# Patient Record
Sex: Male | Born: 2000 | Race: White | Hispanic: No | Marital: Single | Smoking: Never smoker
Health system: Southern US, Community
[De-identification: ages and names within clinical notes are randomized; demographics above are authoritative.]

## PROBLEM LIST (undated history)

## (undated) DIAGNOSIS — F909 Attention-deficit hyperactivity disorder, unspecified type: Secondary | ICD-10-CM

## (undated) DIAGNOSIS — R112 Nausea with vomiting, unspecified: Secondary | ICD-10-CM

## (undated) DIAGNOSIS — R040 Epistaxis: Secondary | ICD-10-CM

## (undated) DIAGNOSIS — Z9889 Other specified postprocedural states: Secondary | ICD-10-CM

## (undated) HISTORY — PX: NOSE SURGERY: SHX723

---
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
## 2022-02-04 ENCOUNTER — Encounter (HOSPITAL_BASED_OUTPATIENT_CLINIC_OR_DEPARTMENT_OTHER): Payer: Self-pay | Admitting: Emergency Medicine

## 2022-02-04 ENCOUNTER — Emergency Department (HOSPITAL_BASED_OUTPATIENT_CLINIC_OR_DEPARTMENT_OTHER): Payer: Medicaid Other | Admitting: Radiology

## 2022-02-04 ENCOUNTER — Other Ambulatory Visit (HOSPITAL_BASED_OUTPATIENT_CLINIC_OR_DEPARTMENT_OTHER): Payer: Self-pay

## 2022-02-04 ENCOUNTER — Other Ambulatory Visit: Payer: Self-pay

## 2022-02-04 ENCOUNTER — Emergency Department (HOSPITAL_BASED_OUTPATIENT_CLINIC_OR_DEPARTMENT_OTHER)
Admission: EM | Admit: 2022-02-04 | Discharge: 2022-02-04 | Disposition: A | Discharge status: Home / Self Care | Payer: Medicaid Other | Attending: Emergency Medicine | Admitting: Emergency Medicine

## 2022-02-04 DIAGNOSIS — Y9364 Activity, baseball: Secondary | ICD-10-CM | POA: Insufficient documentation

## 2022-02-04 DIAGNOSIS — X501XXA Overexertion from prolonged static or awkward postures, initial encounter: Secondary | ICD-10-CM | POA: Diagnosis not present

## 2022-02-04 DIAGNOSIS — S43005A Unspecified dislocation of left shoulder joint, initial encounter: Secondary | ICD-10-CM | POA: Diagnosis not present

## 2022-02-04 DIAGNOSIS — S4992XA Unspecified injury of left shoulder and upper arm, initial encounter: Secondary | ICD-10-CM | POA: Diagnosis present

## 2022-02-04 DIAGNOSIS — M25512 Pain in left shoulder: Secondary | ICD-10-CM | POA: Insufficient documentation

## 2022-02-04 MED ORDER — ACETAMINOPHEN 500 MG PO TABS
500.0000 mg | ORAL_TABLET | Freq: Four times a day (QID) | ORAL | 0 refills | Status: DC | PRN
  Filled 2022-02-04: qty 30, 8d supply, fill #0

## 2022-02-04 MED ORDER — NAPROXEN 250 MG PO TABS
500.0000 mg | ORAL_TABLET | Freq: Once | ORAL | Status: AC
  Administered 2022-02-04: 500 mg via ORAL
  Filled 2022-02-04: qty 2

## 2022-02-04 MED ORDER — ACETAMINOPHEN 325 MG PO TABS
650.0000 mg | ORAL_TABLET | Freq: Once | ORAL | Status: AC
  Administered 2022-02-04: 650 mg via ORAL
  Filled 2022-02-04: qty 2

## 2022-02-04 MED ORDER — NAPROXEN 500 MG PO TABS
500.0000 mg | ORAL_TABLET | Freq: Two times a day (BID) | ORAL | 0 refills | Status: DC
  Filled 2022-02-04: qty 10, 5d supply, fill #0

## 2022-02-04 NOTE — ED Triage Notes (Signed)
Pt left shoulder popped out of joint yesterday during baseball game. Coach popped shoulder back into joint. Pt thinks shoulder popped back out during sleep last night. Limited range of motion.  Fingers "slightly" numb. Color good, cap refill WDL and pulse good.

## 2022-02-04 NOTE — ED Provider Notes (Signed)
[facility] EMERGENCY DEPT Provider Note   CSN: [id number] Arrival date & time: 02/04/22  0930     History  Chief Complaint  Patient presents with   Shoulder Pain    Eric Tapia is a 21 y.o. male.  HPI     21 year old male comes in with chief complaint of shoulder pain.  Patient indicates that he was playing baseball yesterday, when he dove to catch a ball.  In the process he landed on his left shoulder.  He heard a pop.  His coach " popped" his shoulder back.  He thinks that the shoulder might of popped out again during sleep.  He is having pain over the left shoulder.  He has mild numbness to his digits.   Home Medications Prior to Admission medications   Medication Sig Start Date End Date Taking? Authorizing Provider  acetaminophen (TYLENOL) 500 MG tablet Take 1 tablet (500 mg total) by mouth every 6 (six) hours as needed. 02/04/22  Yes Mycheal Veldhuizen, MD  naproxen (NAPROSYN) 500 MG tablet Take 1 tablet (500 mg total) by mouth 2 (two) times daily. 02/04/22  Yes Derwood Kaplan, MD  ibuprofen (ADVIL) 200 MG tablet Take 400 mg by mouth every 8 (eight) hours as needed.    [provider]      Allergies    Patient has no known allergies.    Review of Systems   Review of Systems  Physical Exam Updated Vital Signs BP 115/69 (BP Location: Right Arm)   Pulse (!) 57   Temp 97.8 F (36.6 C) (Oral)   Resp 17   Ht 6\' 5"  (1.956 m)   Wt 68 kg   SpO2 100%   BMI 17.79 kg/m  Physical Exam Vitals and nursing note reviewed.  Constitutional:      Appearance: He is well-developed.  HENT:     Head: Atraumatic.  Cardiovascular:     Rate and Rhythm: Normal rate.  Pulmonary:     Effort: Pulmonary effort is normal.  Musculoskeletal:        General: Tenderness present.     Cervical back: Neck supple.     Comments: No gross deformity or swelling.  Patient has tenderness over the distal clavicle, AC joint and GH joint area.  Patient has tenderness with minimal  range of motion at this time.  Skin:    General: Skin is warm.  Neurological:     Mental Status: He is alert and oriented to person, place, and time.     ED Results / Procedures / Treatments   Labs (all labs ordered are listed, but only abnormal results are displayed) Labs Reviewed - No data to display  EKG None  Radiology DG Shoulder Left  Result Date: 02/04/2022 CLINICAL DATA:  Left shoulder dislocation during a baseball game yesterday. EXAM: LEFT SHOULDER - 2+ VIEW COMPARISON:  Left humerus x-rays dated Dec 13, 2018. FINDINGS: There is no evidence of fracture or dislocation. There is no evidence of arthropathy or other focal bone abnormality. Soft tissues are unremarkable. IMPRESSION: Negative. Electronically Signed   By: Dec 15, 2018 M.D.   On: 02/04/2022 10:16    Procedures Procedures    Medications Ordered in ED Medications  naproxen (NAPROSYN) tablet 500 mg (500 mg Oral Given 02/04/22 1045)  acetaminophen (TYLENOL) tablet 650 mg (650 mg Oral Given 02/04/22 1045)    ED Course/ Medical Decision Making/ A&P  Medical Decision Making Amount and/or Complexity of Data Reviewed Radiology: ordered.  Risk OTC drugs. Prescription drug management.  This patient presents to the ED with chief complaint(s) of shoulder pain with pertinent past medical history of shoulder injury yesterday, where his shoulder popped out and was " popped back in" by the code which further complicates the presenting complaint.   The differential diagnosis includes : Shoulder dislocation, shoulder avulsion, AC separation, occult humeral fracture, soft tissue sprain, strain or tear.  The initial plan is to put patient in a sling.  Advised RICE treatment with anti-inflammatory medication.  Patient will need to see an orthopedist in 1 week.   Additional history obtained: Additional history obtained from  family member at the bedside   Independent visualization of  imaging: - I independently visualized the following imaging with scope of interpretation limited to determining acute life threatening conditions related to emergency care: X-ray of the left shoulder, which revealed questionable occult fracture in the proximal humeral head.  No dislocation.  Treatment and Reassessment: Results discussed.  Will discharge with sling.   Final Clinical Impression(s) / ED Diagnoses Final diagnoses:  Shoulder dislocation, left, initial encounter    Rx / DC Orders ED Discharge Orders          Ordered    naproxen (NAPROSYN) 500 MG tablet  2 times daily        02/04/22 1054    acetaminophen (TYLENOL) 500 MG tablet  Every 6 hours PRN        02/04/22 1054              Derwood Kaplan, MD 02/04/22 1110

## 2022-02-04 NOTE — Discharge Instructions (Addendum)
X-rays reassuring.  There could be a small fracture that is being missed on the x-ray. Given that you had shoulder dislocation or avulsion, it is still prudent that you follow-up with the orthopedic doctor and keep the arm in the sling until then.  Basic range of motion exercises can still be done while the arm is in the sling.  Ice the area of pain aggressively for the next 2 days.  Take Naprosyn for anti-inflammatory effects and Tylenol in between as needed for pain control.  Call the orthopedist at the number provided to set up an appointment in 7 to 10 days.

## 2022-02-13 ENCOUNTER — Ambulatory Visit (INDEPENDENT_AMBULATORY_CARE_PROVIDER_SITE_OTHER): Payer: Medicaid Other | Admitting: Orthopaedic Surgery

## 2022-02-13 DIAGNOSIS — S43015A Anterior dislocation of left humerus, initial encounter: Secondary | ICD-10-CM | POA: Diagnosis not present

## 2022-02-13 NOTE — Progress Notes (Signed)
Chief Complaint: Left shoulder dislocation     History of Present Illness:    Eric Tapia is a 21 y.o. male left hand dominant male presents with a left anterior shoulder dislocation while he was playing baseball on July 30 and going away out for catching outfield.  He states that several days later he was playing in bed and felt the shoulder again pop out and relocate when he rolled onto it.  Denies any history of shoulder instability.  Denies any familial history of ligamentous laxity.  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  He does not have any other significant medical history.  He has been working on a home exercise program with regard to the left shoulder strengthening since this occurred.  Here today for further assessment.    Surgical History:   None  PMH/PSH/Family History/Social History/Meds/Allergies:    Past Medical History:  Diagnosis Date   Attention deficit hyperactivity disorder (ADHD)    Epistaxis    Past Surgical History:  Procedure Laterality Date   NOSE SURGERY     Social History   Socioeconomic History   Marital status: Single    Spouse name: Not on file   Number of children: Not on file   Years of education: Not on file   Highest education level: Not on file  Occupational History   Not on file  Tobacco Use   Smoking status: Passive Smoke Exposure - Never Smoker   Smokeless tobacco: Never  Vaping Use   Vaping Use: Not on file  Substance and Sexual Activity   Alcohol use: No   Drug use: No   Sexual activity: Not Currently  Other Topics Concern   Not on file  Social History Narrative   Not on file   Social Determinants of Health   Financial Resource Strain: Not on file  Food Insecurity: Not on file  Transportation Needs: Not on file  Physical Activity: Not on file  Stress: Not on file  Social Connections: Not on file   No family history on file. No Known  Allergies Current Outpatient Medications  Medication Sig Dispense Refill   acetaminophen (TYLENOL) 500 MG tablet Take 1 tablet (500 mg total) by mouth every 6 (six) hours as needed. 30 tablet 0   ibuprofen (ADVIL) 200 MG tablet Take 400 mg by mouth every 8 (eight) hours as needed.     naproxen (NAPROSYN) 500 MG tablet Take 1 tablet (500 mg total) by mouth 2 (two) times daily. 10 tablet 0   No current facility-administered medications for this visit.   No results found.  Review of Systems:   A ROS was performed including pertinent positives and negatives as documented in the HPI.  Physical Exam :   Constitutional: NAD and appears stated age Neurological: Alert and oriented Psych: Appropriate affect and cooperative There were no vitals taken for this visit.   Comprehensive Musculoskeletal Exam:    Musculoskeletal Exam    Inspection Right Left  Skin No atrophy or winging No atrophy or winging  Palpation    Tenderness None Glenohumeral  Range oGlenohumeralotion    Flexion (passive) 170 170  Flexion (active) 170 170  Abduction 170 170  ER at the side 70 70  Can reach behind back to T12 T12  Strength  Full Full  Special Tests    Pseudoparalytic No No  Neurologic    Fires PIN, radial, median, ulnar, musculocutaneous, axillary, suprascapular, long thoracic, and spinal accessory innervated muscles. No abnormal sensibility  Vascular/Lymphatic    Radial Pulse 2+ 2+  Cervical Exam    Patient has symmetric cervical range of motion with negative Spurling's test.  Special Test: Positive apprehension with improved apprehension with an anterior relocation force     Imaging:   Xray (3 views left shoulder): Normal   I personally reviewed and interpreted the radiographs.   Assessment:   21 y.o. male left-hand-dominant male presents with a left anterior shoulder dislocation that has now occurred twice following an injury where he landed on the arm playing baseball 2 weeks prior.   I did discuss that given the fact that he has not had to instability episodes 1 of which was just laying on the shoulder I do believe an MRI is indicated in order to rule out a labral injury and for further discussion of treating his instability.  We will plan to proceed with an MRI with intra-articular contrast and return to clinic following this.  Plan :    -Plan for left shoulder MRI  I believe that advance imaging in the form of an MRI is indicated for the following reasons: -Xrays images were obtained and not diagnostic -The patient has failed treatment modalities including rest, home program -The following worrisome symptoms are present on history and exam: Acute injury specific to an anterior shoulder dislocation with concern for labral injury - MRI is required to assist in specific surgical planning       I personally saw and evaluated the patient, and participated in the management and treatment plan.  Huel Cote, MD Attending Physician, Orthopedic Surgery  This document was dictated using Dragon voice recognition software. A reasonable attempt at proof reading has been made to minimize errors.

## 2022-02-14 ENCOUNTER — Telehealth: Payer: Self-pay | Admitting: Orthopaedic Surgery

## 2022-02-14 NOTE — Telephone Encounter (Signed)
Pt's grandmother called requesting a call back from [place] . She states she has questions about MRI/Surgery. Phone number is [phone].

## 2022-02-14 NOTE — Telephone Encounter (Signed)
LMOM requesting CB

## 2022-02-14 NOTE — Telephone Encounter (Signed)
Patient's grandmother needed CPT codes for potential surgery for patient to provide to [facility], I provided code [id number] but informed the patient's grandmother, that we can not guarantee anything until the MRI is reviewed

## 2022-02-18 ENCOUNTER — Other Ambulatory Visit (HOSPITAL_BASED_OUTPATIENT_CLINIC_OR_DEPARTMENT_OTHER): Payer: Self-pay

## 2022-02-25 ENCOUNTER — Other Ambulatory Visit: Payer: Self-pay

## 2022-02-25 ENCOUNTER — Inpatient Hospital Stay: Admission: RE | Admit: 2022-02-25 | Payer: Self-pay | Source: Ambulatory Visit

## 2022-02-27 ENCOUNTER — Ambulatory Visit (HOSPITAL_BASED_OUTPATIENT_CLINIC_OR_DEPARTMENT_OTHER): Payer: Self-pay | Admitting: Orthopaedic Surgery

## 2022-03-06 ENCOUNTER — Ambulatory Visit (HOSPITAL_BASED_OUTPATIENT_CLINIC_OR_DEPARTMENT_OTHER): Payer: Self-pay | Admitting: Orthopaedic Surgery

## 2022-04-29 ENCOUNTER — Telehealth: Payer: Self-pay | Admitting: Orthopaedic Surgery

## 2022-04-29 NOTE — Telephone Encounter (Signed)
Pt grandmother called about an mri. The best number to reach her [phone]

## 2022-04-30 NOTE — Telephone Encounter (Signed)
LMOM to return call.

## 2022-05-01 ENCOUNTER — Other Ambulatory Visit (HOSPITAL_BASED_OUTPATIENT_CLINIC_OR_DEPARTMENT_OTHER): Payer: Self-pay | Admitting: Orthopaedic Surgery

## 2022-05-01 DIAGNOSIS — S43015A Anterior dislocation of left humerus, initial encounter: Secondary | ICD-10-CM

## 2022-05-01 NOTE — Telephone Encounter (Signed)
Spoke to patient's grnadmother and she stated they have insurance  now and I have reordered the MRI for authorization

## 2022-06-11 IMAGING — DX DG HAND COMPLETE 3+V*R*
3 series · 3 of 3 positions shown · non-contrast
Comparison: 03/23/2018

CLINICAL DATA: Intermittent pain fifth metacarpal

EXAM:
RIGHT HAND - COMPLETE 3+ VIEW

[hand pa]
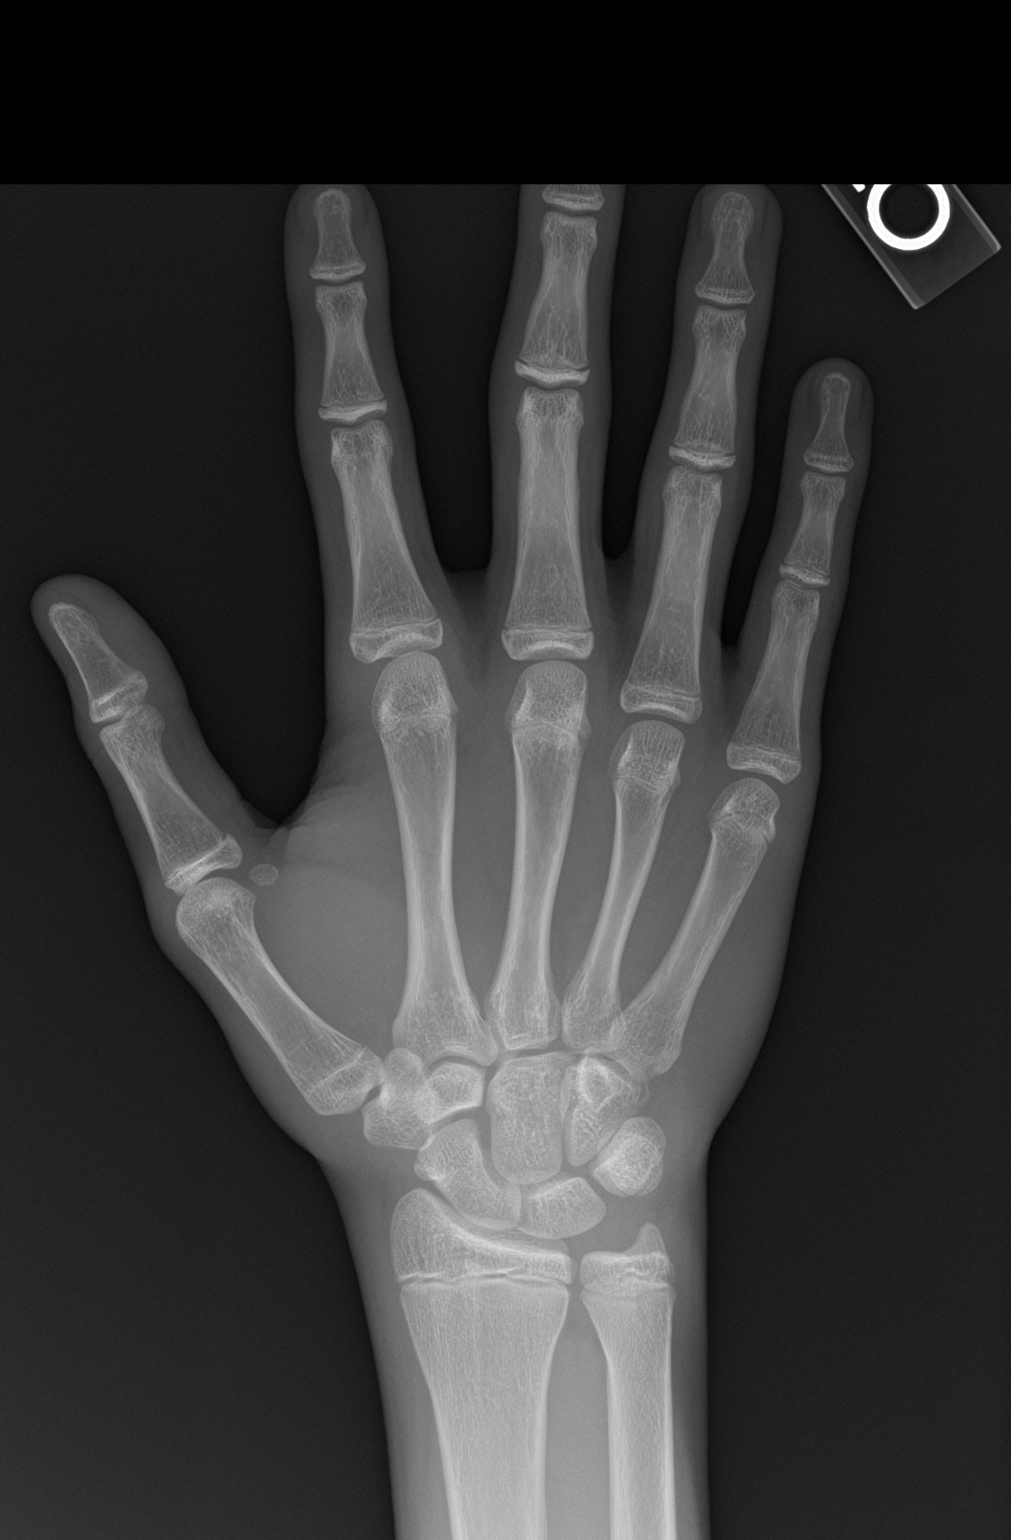

[hand obl]
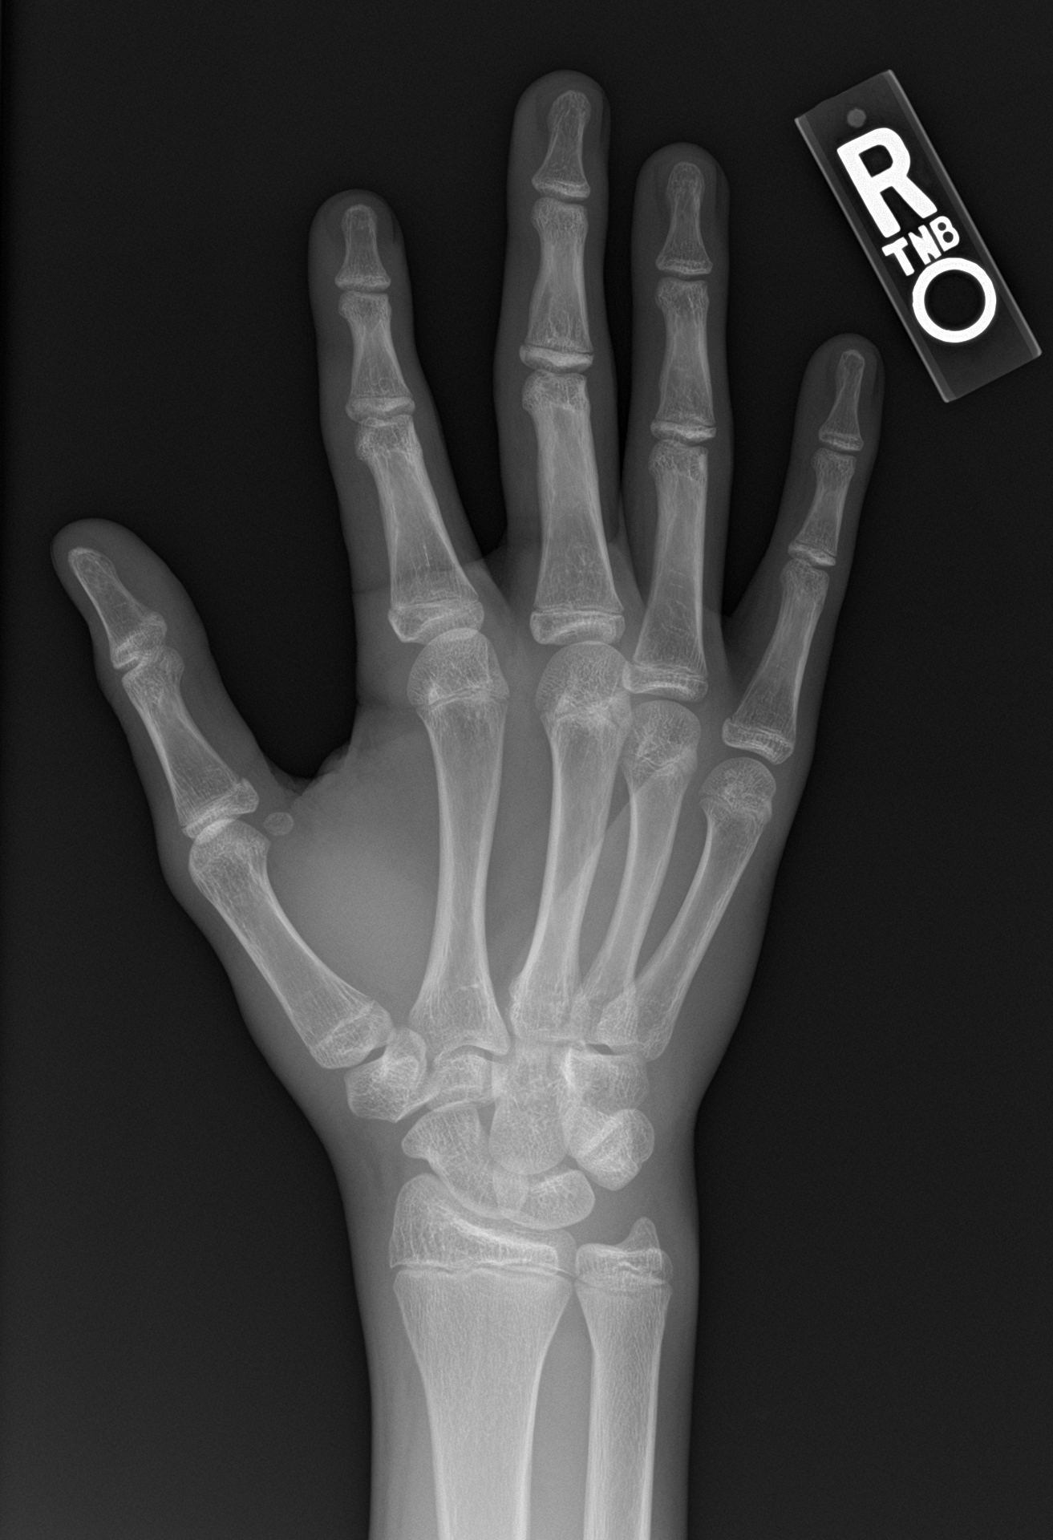

[hand lat]
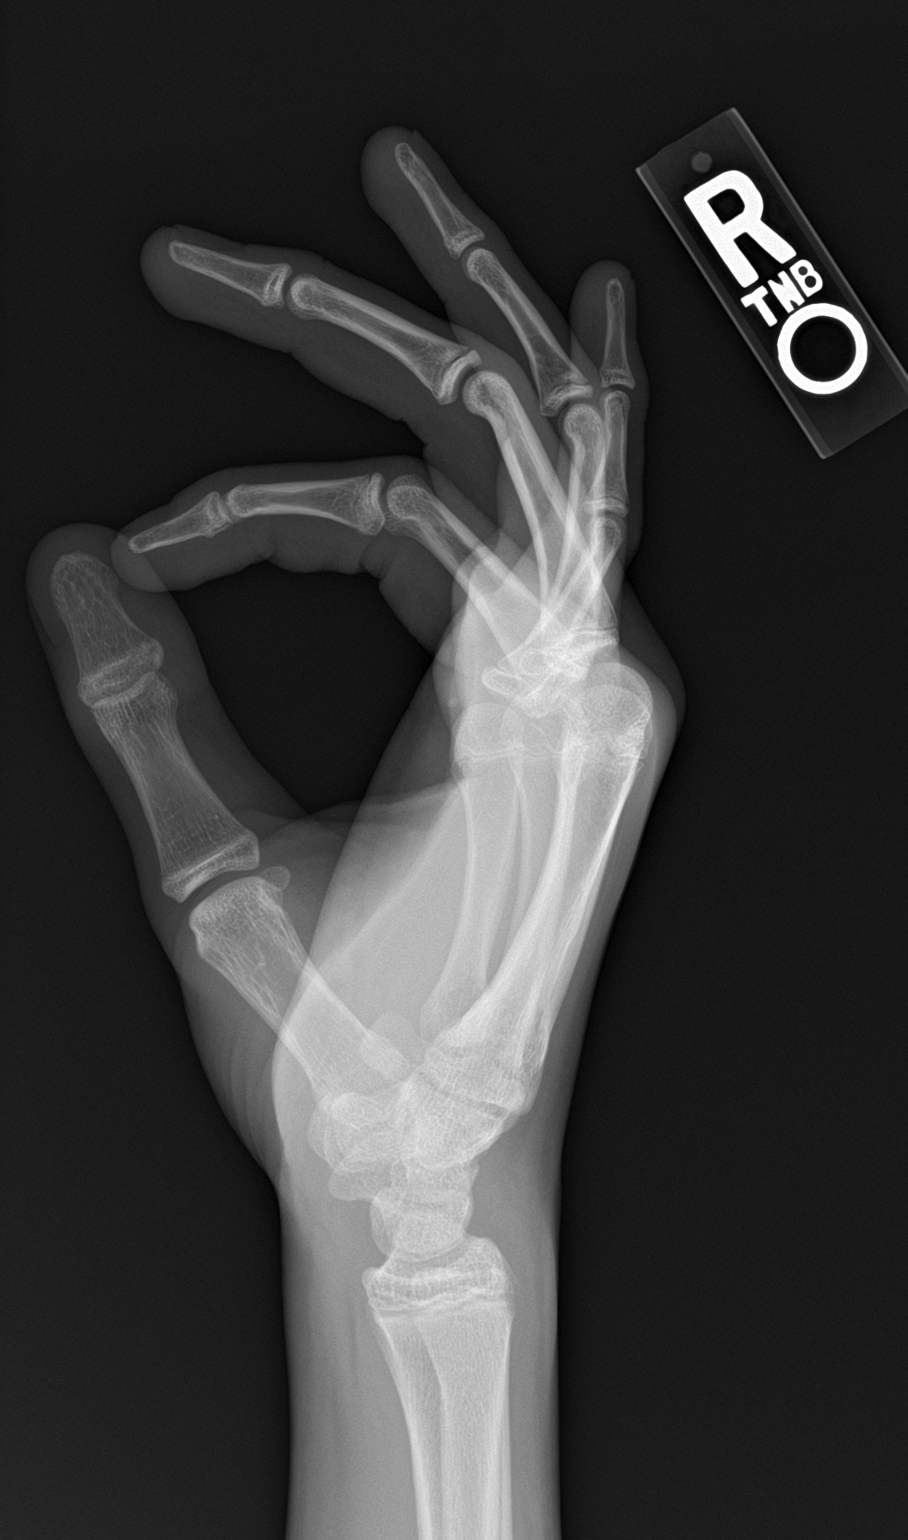

[3 of 3 positions shown; findings below may reference images not displayed]

FINDINGS: There is no evidence of fracture or dislocation. There is no
evidence of arthropathy or other focal bone abnormality. Soft
tissues are unremarkable.
IMPRESSION: Negative.

## 2022-07-15 IMAGING — DX DG KNEE COMPLETE 4+V*R*
4 series · 4 of 4 positions shown · non-contrast
Comparison: None.

CLINICAL DATA: Slight injury.

EXAM:
RIGHT KNEE - COMPLETE 4+ VIEW

[t knee ap right]
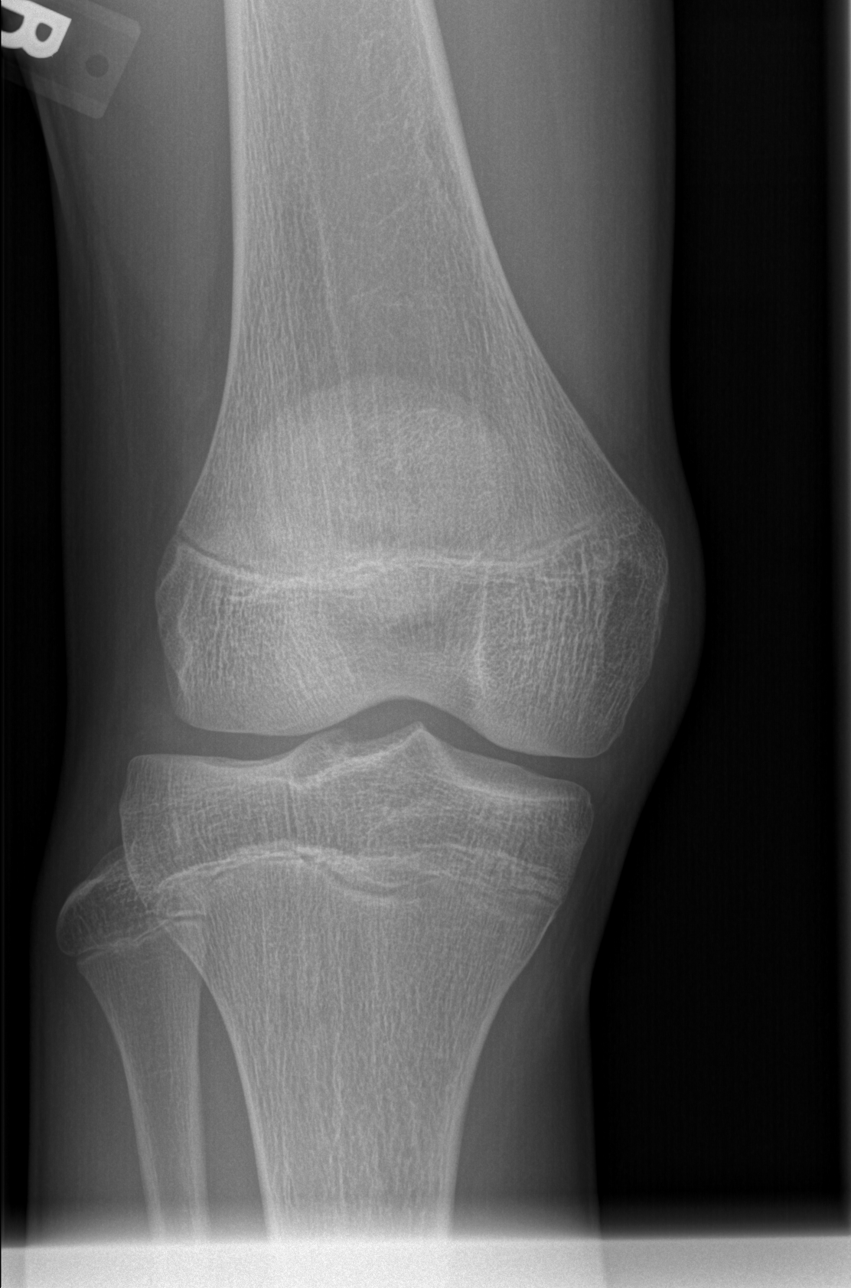

[t knee obl right (1 of 2)]
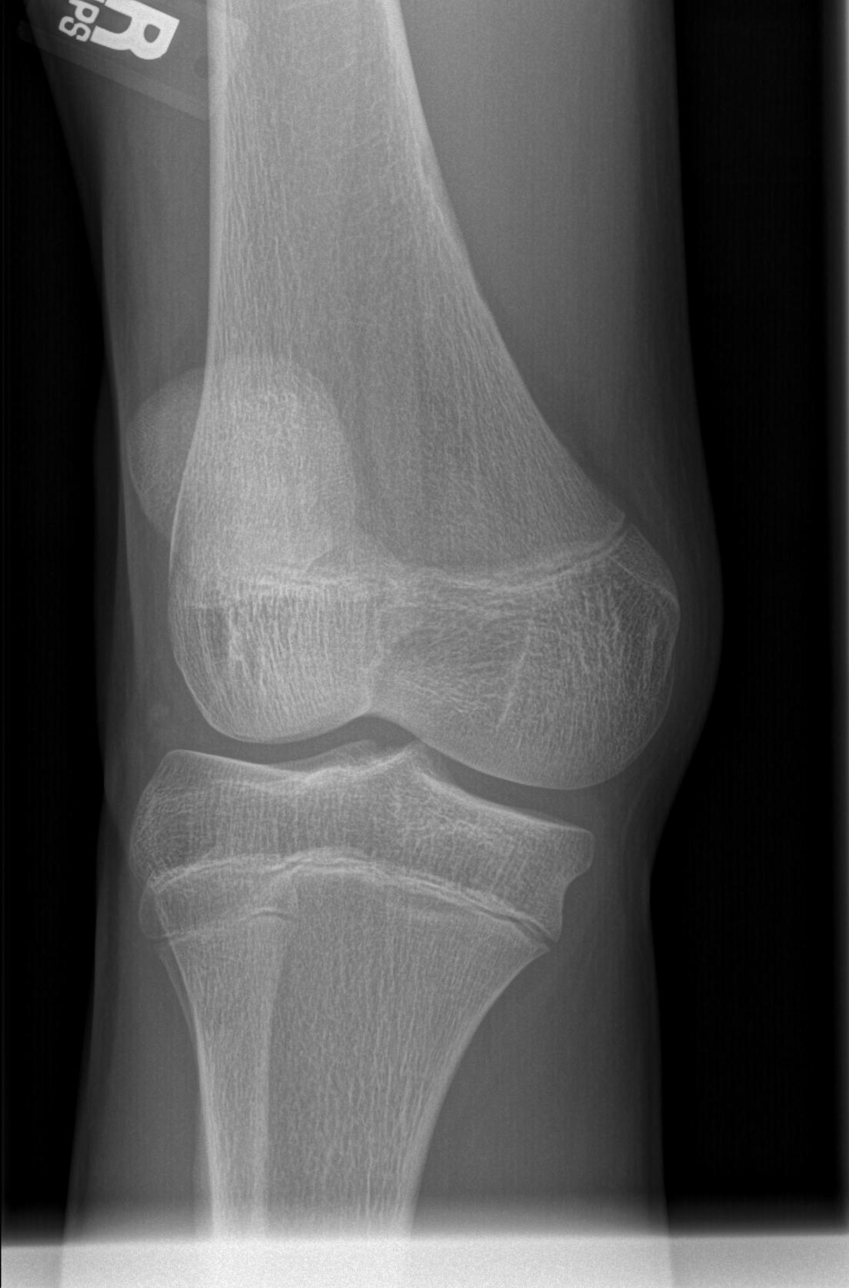

[t knee obl right (2 of 2)]
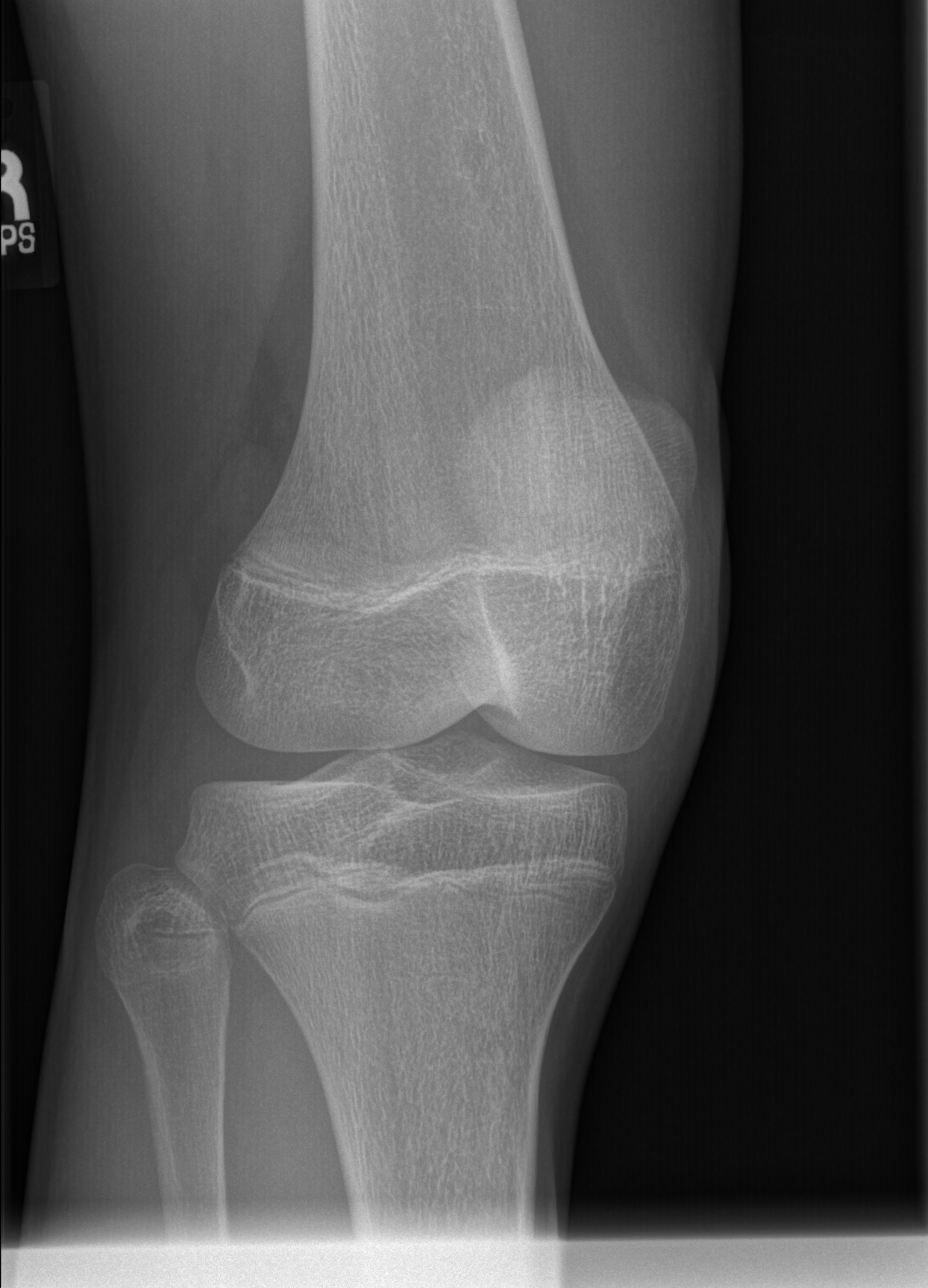

[t knee lat right]
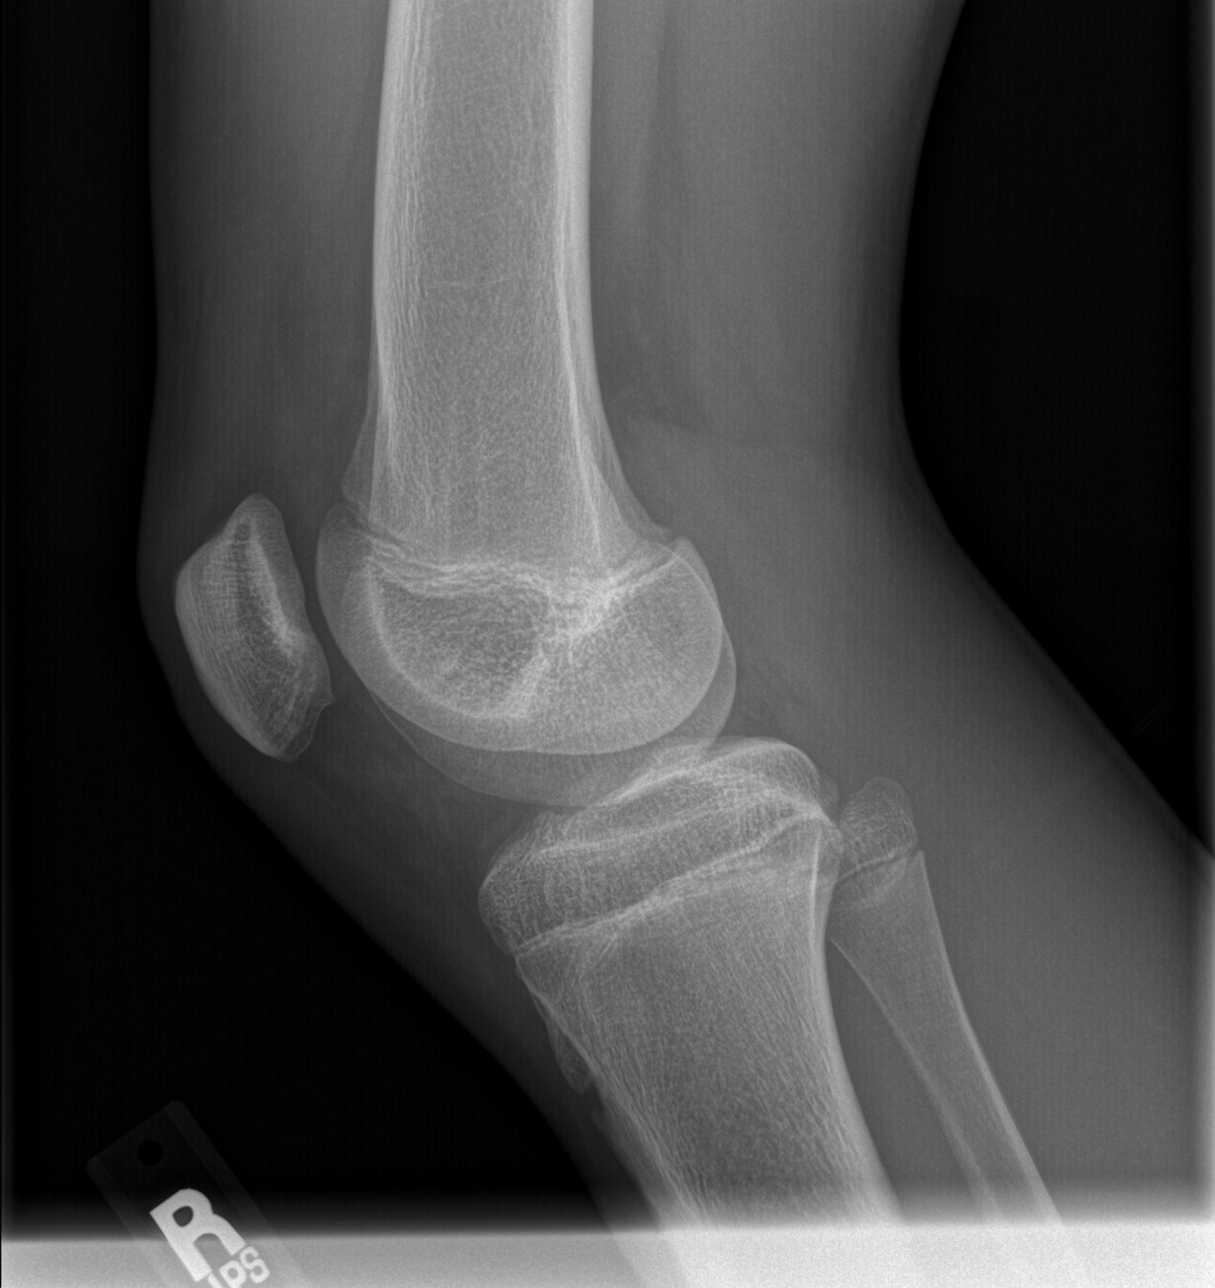

[4 of 4 positions shown; findings below may reference images not displayed]

FINDINGS: No evidence of fracture, dislocation, or joint effusion. No evidence
of arthropathy or other focal bone abnormality. Soft tissues are
unremarkable.
IMPRESSION: Negative.

## 2022-07-16 ENCOUNTER — Other Ambulatory Visit (HOSPITAL_BASED_OUTPATIENT_CLINIC_OR_DEPARTMENT_OTHER): Payer: Self-pay | Admitting: Orthopaedic Surgery

## 2022-07-16 ENCOUNTER — Encounter (HOSPITAL_BASED_OUTPATIENT_CLINIC_OR_DEPARTMENT_OTHER): Payer: Self-pay | Admitting: Orthopaedic Surgery

## 2022-07-16 DIAGNOSIS — S43015A Anterior dislocation of left humerus, initial encounter: Secondary | ICD-10-CM

## 2022-08-08 ENCOUNTER — Ambulatory Visit
Admission: RE | Admit: 2022-08-08 | Discharge: 2022-08-08 | Disposition: A | Discharge status: Home / Self Care | Payer: Medicaid Other | Source: Ambulatory Visit | Attending: Orthopaedic Surgery | Admitting: Orthopaedic Surgery

## 2022-08-08 DIAGNOSIS — S43015A Anterior dislocation of left humerus, initial encounter: Secondary | ICD-10-CM

## 2022-08-08 DIAGNOSIS — M25512 Pain in left shoulder: Secondary | ICD-10-CM | POA: Diagnosis not present

## 2022-08-08 MED ORDER — IOPAMIDOL (ISOVUE-M 200) INJECTION 41%
15.0000 mL | Freq: Once | INTRAMUSCULAR | Status: AC
  Administered 2022-08-08: 15 mL via INTRA_ARTICULAR

## 2022-08-15 ENCOUNTER — Other Ambulatory Visit (HOSPITAL_BASED_OUTPATIENT_CLINIC_OR_DEPARTMENT_OTHER): Payer: Self-pay

## 2022-08-15 ENCOUNTER — Ambulatory Visit (HOSPITAL_BASED_OUTPATIENT_CLINIC_OR_DEPARTMENT_OTHER): Payer: Self-pay | Admitting: Orthopaedic Surgery

## 2022-08-15 ENCOUNTER — Ambulatory Visit (INDEPENDENT_AMBULATORY_CARE_PROVIDER_SITE_OTHER): Payer: Medicaid Other | Admitting: Orthopaedic Surgery

## 2022-08-15 DIAGNOSIS — S43015A Anterior dislocation of left humerus, initial encounter: Secondary | ICD-10-CM

## 2022-08-15 MED ORDER — OXYCODONE HCL 5 MG PO TABS
5.0000 mg | ORAL_TABLET | ORAL | 0 refills | Status: DC | PRN
  Filled 2022-08-15: qty 10, 2d supply, fill #0

## 2022-08-15 MED ORDER — IBUPROFEN 800 MG PO TABS
800.0000 mg | ORAL_TABLET | Freq: Three times a day (TID) | ORAL | 0 refills | Status: AC
  Filled 2022-08-15: qty 30, 10d supply, fill #0

## 2022-08-15 MED ORDER — ACETAMINOPHEN 500 MG PO TABS
500.0000 mg | ORAL_TABLET | Freq: Three times a day (TID) | ORAL | 0 refills | Status: AC
  Filled 2022-08-15: qty 30, 10d supply, fill #0

## 2022-08-15 MED ORDER — ASPIRIN 325 MG PO TBEC
325.0000 mg | DELAYED_RELEASE_TABLET | Freq: Every day | ORAL | 0 refills | Status: DC
  Filled 2022-08-15: qty 30, 30d supply, fill #0

## 2022-08-15 NOTE — Progress Notes (Signed)
Chief Complaint: Left shoulder dislocation     History of Present Illness:   08/15/2022: Presents today for follow-up of his left shoulder.  He is having an extremely difficult time pitching at this point.  He is now having apprehension with overhead throwing.  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  Eric Tapia is a 22 y.o. male left hand dominant male presents with a left anterior shoulder dislocation while he was playing baseball on July 30 and going away out for catching outfield.  He states that several days later he was playing in bed and felt the shoulder again pop out and relocate when he rolled onto it.  Denies any history of shoulder instability.  Denies any familial history of ligamentous laxity.  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  He does not have any other significant medical history.  He has been working on a home exercise program with regard to the left shoulder strengthening since this occurred.  Here today for further assessment.    Surgical History:   None  PMH/PSH/Family History/Social History/Meds/Allergies:    Past Medical History:  Diagnosis Date   Attention deficit hyperactivity disorder (ADHD)    Epistaxis    Past Surgical History:  Procedure Laterality Date   NOSE SURGERY     Social History   Socioeconomic History   Marital status: Single    Spouse name: Not on file   Number of children: Not on file   Years of education: Not on file   Highest education level: Not on file  Occupational History   Not on file  Tobacco Use   Smoking status: Passive Smoke Exposure - Never Smoker   Smokeless tobacco: Never  Vaping Use   Vaping Use: Not on file  Substance and Sexual Activity   Alcohol use: No   Drug use: No   Sexual activity: Not Currently  Other Topics Concern   Not on file  Social History Narrative   Not on file   Social Determinants of Health    Financial Resource Strain: Not on file  Food Insecurity: Not on file  Transportation Needs: Not on file  Physical Activity: Not on file  Stress: Not on file  Social Connections: Not on file   No family history on file. No Known Allergies Current Outpatient Medications  Medication Sig Dispense Refill   acetaminophen (TYLENOL) 500 MG tablet Take 1 tablet (500 mg total) by mouth every 8 (eight) hours for 10 days. 30 tablet 0   aspirin EC 325 MG tablet Take 1 tablet (325 mg total) by mouth daily. 30 tablet 0   ibuprofen (ADVIL) 800 MG tablet Take 1 tablet (800 mg total) by mouth every 8 (eight) hours for 10 days. Please take with food, please alternate with acetaminophen 30 tablet 0   oxyCODONE (OXY IR/ROXICODONE) 5 MG immediate release tablet Take 1 tablet (5 mg total) by mouth every 4 (four) hours as needed (severe pain). 10 tablet 0   acetaminophen (TYLENOL) 500 MG tablet Take 1 tablet (500 mg total) by mouth every 6 (six) hours as needed. 30 tablet 0   ibuprofen (ADVIL) 200 MG tablet Take 400 mg by mouth every 8 (eight) hours as needed.     naproxen (NAPROSYN) 500 MG tablet Take 1  tablet (500 mg total) by mouth 2 (two) times daily. 10 tablet 0   No current facility-administered medications for this visit.   No results found.  Review of Systems:   A ROS was performed including pertinent positives and negatives as documented in the HPI.  Physical Exam :   Constitutional: NAD and appears stated age Neurological: Alert and oriented Psych: Appropriate affect and cooperative There were no vitals taken for this visit.   Comprehensive Musculoskeletal Exam:    Musculoskeletal Exam    Inspection Right Left  Skin No atrophy or winging No atrophy or winging  Palpation    Tenderness None Glenohumeral  Range oGlenohumeralotion    Flexion (passive) 170 170  Flexion (active) 170 170  Abduction 170 170  ER at the side 70 70  Can reach behind back to T12 T12  Strength     Full Full   Special Tests    Pseudoparalytic No No  Neurologic    Fires PIN, radial, median, ulnar, musculocutaneous, axillary, suprascapular, long thoracic, and spinal accessory innervated muscles. No abnormal sensibility  Vascular/Lymphatic    Radial Pulse 2+ 2+  Cervical Exam    Patient has symmetric cervical range of motion with negative Spurling's test.  Special Test: Positive apprehension with improved apprehension with an anterior relocation force     Imaging:   Xray (3 views left shoulder): Normal  MRI left shoulder: There is an anterior inferior labral tear   I personally reviewed and interpreted the radiographs.   Assessment:   22 y.o. male left-hand-dominant male presents with a left anterior shoulder dislocation that has now occurred twice following an injury where he landed on the arm playing baseball 2 weeks prior.  Unfortunately does have recurrent instability and subluxation now happening multiple times weekly.  At this time I did discuss additional treatment options including physical therapy versus steroid injections.  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  Side effect we did discuss surgical management including arthroscopy with labral repair.  After long discussion he has elected for arthroscopic labral repair.  Plan :    -Plan for left shoulder arthroscopy with labral repair    After a lengthy discussion of treatment options, including risks, benefits, alternatives, complications of surgical and nonsurgical conservative options, the patient elected surgical repair.   The patient  is aware of the material risks  and complications including, but not limited to injury to adjacent structures, neurovascular injury, infection, numbness, bleeding, implant failure, thermal burns, stiffness, persistent pain, failure to heal, disease transmission from allograft, need  for further surgery, dislocation, anesthetic risks, blood clots, risks of death,and others. The probabilities of surgical success and failure discussed with patient given their particular co-morbidities.The time and nature of expected rehabilitation and recovery was discussed.The patient's questions were all answered preoperatively.  No barriers to understanding were noted. I explained the natural history of the disease process and Rx rationale.  I explained to the patient what I considered to be reasonable expectations given their personal situation.  The final treatment plan was arrived at through a shared patient decision making process model.       I personally saw and evaluated the patient, and participated in the management and treatment plan.  Vanetta Mulders, MD Attending Physician, Orthopedic Surgery  This document was dictated using Dragon voice recognition software. A reasonable attempt at proof reading has been made to  minimize errors.

## 2022-08-15 NOTE — H&P (View-Only) (Signed)
Chief Complaint: Left shoulder dislocation     History of Present Illness:   08/15/2022: Presents today for follow-up of his left shoulder.  He is having an extremely difficult time pitching at this point.  He is now having apprehension with overhead throwing.  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  Eric Tapia is a 22 y.o. male left hand dominant male presents with a left anterior shoulder dislocation while he was playing baseball on July 30 and going away out for catching outfield.  He states that several days later he was playing in bed and felt the shoulder again pop out and relocate when he rolled onto it.  Denies any history of shoulder instability.  Denies any familial history of ligamentous laxity.  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  He does not have any other significant medical history.  He has been working on a home exercise program with regard to the left shoulder strengthening since this occurred.  Here today for further assessment.    Surgical History:   None  PMH/PSH/Family History/Social History/Meds/Allergies:    Past Medical History:  Diagnosis Date   Attention deficit hyperactivity disorder (ADHD)    Epistaxis    Past Surgical History:  Procedure Laterality Date   NOSE SURGERY     Social History   Socioeconomic History   Marital status: Single    Spouse name: Not on file   Number of children: Not on file   Years of education: Not on file   Highest education level: Not on file  Occupational History   Not on file  Tobacco Use   Smoking status: Passive Smoke Exposure - Never Smoker   Smokeless tobacco: Never  Vaping Use   Vaping Use: Not on file  Substance and Sexual Activity   Alcohol use: No   Drug use: No   Sexual activity: Not Currently  Other Topics Concern   Not on file  Social History Narrative   Not on file   Social Determinants of Health    Financial Resource Strain: Not on file  Food Insecurity: Not on file  Transportation Needs: Not on file  Physical Activity: Not on file  Stress: Not on file  Social Connections: Not on file   No family history on file. No Known Allergies Current Outpatient Medications  Medication Sig Dispense Refill   acetaminophen (TYLENOL) 500 MG tablet Take 1 tablet (500 mg total) by mouth every 8 (eight) hours for 10 days. 30 tablet 0   aspirin EC 325 MG tablet Take 1 tablet (325 mg total) by mouth daily. 30 tablet 0   ibuprofen (ADVIL) 800 MG tablet Take 1 tablet (800 mg total) by mouth every 8 (eight) hours for 10 days. Please take with food, please alternate with acetaminophen 30 tablet 0   oxyCODONE (OXY IR/ROXICODONE) 5 MG immediate release tablet Take 1 tablet (5 mg total) by mouth every 4 (four) hours as needed (severe pain). 10 tablet 0   acetaminophen (TYLENOL) 500 MG tablet Take 1 tablet (500 mg total) by mouth every 6 (six) hours as needed. 30 tablet 0   ibuprofen (ADVIL) 200 MG tablet Take 400 mg by mouth every 8 (eight) hours as needed.     naproxen (NAPROSYN) 500 MG tablet Take 1  tablet (500 mg total) by mouth 2 (two) times daily. 10 tablet 0   No current facility-administered medications for this visit.   No results found.  Review of Systems:   A ROS was performed including pertinent positives and negatives as documented in the HPI.  Physical Exam :   Constitutional: NAD and appears stated age Neurological: Alert and oriented Psych: Appropriate affect and cooperative There were no vitals taken for this visit.   Comprehensive Musculoskeletal Exam:    Musculoskeletal Exam    Inspection Right Left  Skin No atrophy or winging No atrophy or winging  Palpation    Tenderness None Glenohumeral  Range oGlenohumeralotion    Flexion (passive) 170 170  Flexion (active) 170 170  Abduction 170 170  ER at the side 70 70  Can reach behind back to T12 T12  Strength     Full Full   Special Tests    Pseudoparalytic No No  Neurologic    Fires PIN, radial, median, ulnar, musculocutaneous, axillary, suprascapular, long thoracic, and spinal accessory innervated muscles. No abnormal sensibility  Vascular/Lymphatic    Radial Pulse 2+ 2+  Cervical Exam    Patient has symmetric cervical range of motion with negative Spurling's test.  Special Test: Positive apprehension with improved apprehension with an anterior relocation force     Imaging:   Xray (3 views left shoulder): Normal  MRI left shoulder: There is an anterior inferior labral tear   I personally reviewed and interpreted the radiographs.   Assessment:   22 y.o. male left-hand-dominant male presents with a left anterior shoulder dislocation that has now occurred twice following an injury where he landed on the arm playing baseball 2 weeks prior.  Unfortunately does have recurrent instability and subluxation now happening multiple times weekly.  At this time I did discuss additional treatment options including physical therapy versus steroid injections.  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  Side effect we did discuss surgical management including arthroscopy with labral repair.  After long discussion he has elected for arthroscopic labral repair.  Plan :    -Plan for left shoulder arthroscopy with labral repair    After a lengthy discussion of treatment options, including risks, benefits, alternatives, complications of surgical and nonsurgical conservative options, the patient elected surgical repair.   The patient  is aware of the material risks  and complications including, but not limited to injury to adjacent structures, neurovascular injury, infection, numbness, bleeding, implant failure, thermal burns, stiffness, persistent pain, failure to heal, disease transmission from allograft, need  for further surgery, dislocation, anesthetic risks, blood clots, risks of death,and others. The probabilities of surgical success and failure discussed with patient given their particular co-morbidities.The time and nature of expected rehabilitation and recovery was discussed.The patient's questions were all answered preoperatively.  No barriers to understanding were noted. I explained the natural history of the disease process and Rx rationale.  I explained to the patient what I considered to be reasonable expectations given their personal situation.  The final treatment plan was arrived at through a shared patient decision making process model.       I personally saw and evaluated the patient, and participated in the management and treatment plan.  Vanetta Mulders, MD Attending Physician, Orthopedic Surgery  This document was dictated using Dragon voice recognition software. A reasonable attempt at proof reading has been made to  minimize errors. 

## 2022-08-15 NOTE — Addendum Note (Signed)
Addended by: Raynelle Fanning A on: 08/15/2022 10:06 AM   Modules accepted: Orders

## 2022-08-19 ENCOUNTER — Other Ambulatory Visit (HOSPITAL_BASED_OUTPATIENT_CLINIC_OR_DEPARTMENT_OTHER): Payer: Self-pay | Admitting: Orthopaedic Surgery

## 2022-08-19 DIAGNOSIS — S43015A Anterior dislocation of left humerus, initial encounter: Secondary | ICD-10-CM

## 2022-08-20 ENCOUNTER — Encounter (HOSPITAL_BASED_OUTPATIENT_CLINIC_OR_DEPARTMENT_OTHER): Payer: Self-pay | Admitting: Orthopaedic Surgery

## 2022-08-20 ENCOUNTER — Other Ambulatory Visit: Payer: Self-pay

## 2022-08-24 ENCOUNTER — Encounter (HOSPITAL_COMMUNITY): Payer: Self-pay | Admitting: Anesthesiology

## 2022-08-26 NOTE — Progress Notes (Signed)

## 2022-08-27 ENCOUNTER — Ambulatory Visit (HOSPITAL_BASED_OUTPATIENT_CLINIC_OR_DEPARTMENT_OTHER): Admission: RE | Admit: 2022-08-27 | Payer: Medicaid Other | Source: Ambulatory Visit | Admitting: Orthopaedic Surgery

## 2022-08-27 HISTORY — DX: Other specified postprocedural states: R11.2

## 2022-08-27 HISTORY — DX: Other specified postprocedural states: Z98.890

## 2022-08-27 SURGERY — ARTHROSCOPY, SHOULDER, WITH GLENOID LABRUM REPAIR
Anesthesia: Regional | Site: Shoulder | Laterality: Left

## 2022-08-27 MED ORDER — MIDAZOLAM HCL 2 MG/2ML IJ SOLN
INTRAMUSCULAR | Status: AC
  Filled 2022-08-27: qty 2

## 2022-08-28 ENCOUNTER — Encounter (HOSPITAL_BASED_OUTPATIENT_CLINIC_OR_DEPARTMENT_OTHER): Payer: Self-pay | Admitting: Orthopaedic Surgery

## 2022-08-29 ENCOUNTER — Encounter (HOSPITAL_COMMUNITY): Payer: Self-pay | Admitting: Orthopaedic Surgery

## 2022-08-29 ENCOUNTER — Other Ambulatory Visit: Payer: Self-pay

## 2022-08-29 NOTE — Progress Notes (Signed)
PCP - denies Cardiologist - denies  PPM/ICD - denies  CPAP - n/a  Fasting Blood Sugar - n/a  Blood Thinner Instructions: n/a Aspirin Instructions: Patient was instructed: As of today, STOP taking any Aspirin (unless otherwise instructed by your surgeon) Aleve, Naproxen, Ibuprofen, Motrin, Advil, Goody's, BC's, all herbal medications, fish oil, and all vitamins.  ERAS Protcol - yes, until 12:00 o'clock  COVID TEST- n/a  Anesthesia review: no  Patient verbally denies any shortness of breath, fever, cough and chest pain during phone call   -------------  SDW INSTRUCTIONS given:  Your procedure is scheduled on Monday, January 29th, 2024.  Report to [facility] Main Entrance "A" at 12:30 A.M., and check in at the Admitting office.  Call this number if you have problems the morning of surgery:  [phone]   Remember:  Do not eat after midnight the night before your surgery  You may drink clear liquids until 12:00 the morning of your surgery.   Clear liquids allowed are: Water, Non-Citrus Juices (without pulp), Carbonated Beverages, Clear Tea, Black Coffee Only, and Gatorade    Take these medicines the morning of surgery with A SIP OF WATER: PRN - Tylenol   The day of surgery:                     Do not wear jewelry,             Do not wear lotions, powders, colognes, or deodorant.            Men may shave face and neck.            Do not bring valuables to the hospital.            [facility] is not responsible for any belongings or valuables.  Do NOT Smoke (Tobacco/Vaping) 24 hours prior to your procedure If you use a CPAP at night, you may bring all equipment for your overnight stay.   Contacts, glasses, dentures or bridgework may not be worn into surgery.      For patients admitted to the hospital, discharge time will be determined by your treatment team.   Patients discharged the day of surgery will not be allowed to drive home, and someone needs to stay with them  for 24 hours.    Special instructions:   [facility]- Preparing For Surgery  Before surgery, you can play an important role. Because skin is not sterile, your skin needs to be as free of germs as possible. You can reduce the number of germs on your skin by washing with CHG (chlorahexidine gluconate) Soap before surgery.  CHG is an antiseptic cleaner which kills germs and bonds with the skin to continue killing germs even after washing.    Oral Hygiene is also important to reduce your risk of infection.  Remember - BRUSH YOUR TEETH THE MORNING OF SURGERY WITH YOUR REGULAR TOOTHPASTE  Please do not use if you have an allergy to CHG or antibacterial soaps. If your skin becomes reddened/irritated stop using the CHG.  Do not shave (including legs and underarms) for at least 48 hours prior to first CHG shower. It is OK to shave your face.  Please follow these instructions carefully.   Shower the NIGHT BEFORE SURGERY and the MORNING OF SURGERY with DIAL Soap.   Pat yourself dry with a CLEAN TOWEL.  Wear CLEAN PAJAMAS to bed the night before surgery  Place CLEAN SHEETS on your bed the night of  your first shower and DO NOT SLEEP WITH PETS.   Day of Surgery: Please shower morning of surgery  Wear Clean/Comfortable clothing the morning of surgery Do not apply any deodorants/lotions.   Remember to brush your teeth WITH YOUR REGULAR TOOTHPASTE.   Questions were answered. Patient verbalized understanding of instructions.

## 2022-08-30 ENCOUNTER — Ambulatory Visit (HOSPITAL_BASED_OUTPATIENT_CLINIC_OR_DEPARTMENT_OTHER): Payer: Medicaid Other | Admitting: Physical Therapy

## 2022-09-02 ENCOUNTER — Other Ambulatory Visit: Payer: Self-pay

## 2022-09-02 ENCOUNTER — Ambulatory Visit (HOSPITAL_COMMUNITY)
Admission: RE | Admit: 2022-09-02 | Discharge: 2022-09-02 | Disposition: A | Discharge status: Home / Self Care | Payer: Medicaid Other | Source: Ambulatory Visit | Attending: Orthopaedic Surgery | Admitting: Orthopaedic Surgery

## 2022-09-02 ENCOUNTER — Encounter (HOSPITAL_COMMUNITY): Payer: Self-pay | Admitting: Orthopaedic Surgery

## 2022-09-02 ENCOUNTER — Encounter (HOSPITAL_COMMUNITY)
Admission: RE | Disposition: A | Discharge status: Home / Self Care | Payer: Self-pay | Source: Ambulatory Visit | Attending: Orthopaedic Surgery

## 2022-09-02 ENCOUNTER — Ambulatory Visit (HOSPITAL_BASED_OUTPATIENT_CLINIC_OR_DEPARTMENT_OTHER): Payer: Medicaid Other | Admitting: Anesthesiology

## 2022-09-02 ENCOUNTER — Ambulatory Visit (HOSPITAL_COMMUNITY): Payer: Medicaid Other | Admitting: Anesthesiology

## 2022-09-02 DIAGNOSIS — S43492A Other sprain of left shoulder joint, initial encounter: Secondary | ICD-10-CM | POA: Insufficient documentation

## 2022-09-02 DIAGNOSIS — Y9364 Activity, baseball: Secondary | ICD-10-CM | POA: Diagnosis not present

## 2022-09-02 DIAGNOSIS — M25312 Other instability, left shoulder: Secondary | ICD-10-CM | POA: Diagnosis not present

## 2022-09-02 DIAGNOSIS — S43432A Superior glenoid labrum lesion of left shoulder, initial encounter: Secondary | ICD-10-CM | POA: Diagnosis not present

## 2022-09-02 DIAGNOSIS — S43015A Anterior dislocation of left humerus, initial encounter: Secondary | ICD-10-CM

## 2022-09-02 DIAGNOSIS — G8918 Other acute postprocedural pain: Secondary | ICD-10-CM | POA: Diagnosis not present

## 2022-09-02 HISTORY — PX: SHOULDER ARTHROSCOPY WITH LABRAL REPAIR: SHX5691

## 2022-09-02 LAB — CBC
HCT: 43.4 % (ref 39.0–52.0)
Hemoglobin: 15.5 g/dL (ref 13.0–17.0)
MCH: 32.1 pg (ref 26.0–34.0)
MCHC: 35.7 g/dL (ref 30.0–36.0)
MCV: 89.9 fL (ref 80.0–100.0)
Platelets: 197 10*3/uL (ref 150–400)
RBC: 4.83 MIL/uL (ref 4.22–5.81)
RDW: 12.1 % (ref 11.5–15.5)
WBC: 5 10*3/uL (ref 4.0–10.5)
nRBC: 0 % (ref 0.0–0.2)

## 2022-09-02 SURGERY — ARTHROSCOPY, SHOULDER, WITH GLENOID LABRUM REPAIR
Anesthesia: Regional | Site: Shoulder | Laterality: Left

## 2022-09-02 MED ORDER — FENTANYL CITRATE (PF) 250 MCG/5ML IJ SOLN
INTRAMUSCULAR | Status: AC
  Filled 2022-09-02: qty 5

## 2022-09-02 MED ORDER — LIDOCAINE 2% (20 MG/ML) 5 ML SYRINGE
INTRAMUSCULAR | Status: DC | PRN
  Administered 2022-09-02: 60 mg via INTRAVENOUS

## 2022-09-02 MED ORDER — FENTANYL CITRATE (PF) 100 MCG/2ML IJ SOLN: 25.0000 ug | INTRAMUSCULAR | Status: DC | PRN

## 2022-09-02 MED ORDER — MIDAZOLAM HCL 2 MG/2ML IJ SOLN: 2.0000 mg | Freq: Once | INTRAMUSCULAR | Status: AC

## 2022-09-02 MED ORDER — PROPOFOL 10 MG/ML IV BOLUS
INTRAVENOUS | Status: AC
  Filled 2022-09-02: qty 20

## 2022-09-02 MED ORDER — EPHEDRINE SULFATE-NACL 50-0.9 MG/10ML-% IV SOSY
PREFILLED_SYRINGE | INTRAVENOUS | Status: DC | PRN
  Administered 2022-09-02: 5 mg via INTRAVENOUS

## 2022-09-02 MED ORDER — MIDAZOLAM HCL 2 MG/2ML IJ SOLN
INTRAMUSCULAR | Status: AC
  Filled 2022-09-02: qty 2

## 2022-09-02 MED ORDER — LACTATED RINGERS IV SOLN: INTRAVENOUS | Status: DC

## 2022-09-02 MED ORDER — PHENYLEPHRINE 80 MCG/ML (10ML) SYRINGE FOR IV PUSH (FOR BLOOD PRESSURE SUPPORT)
PREFILLED_SYRINGE | INTRAVENOUS | Status: DC | PRN
  Administered 2022-09-02: 80 ug via INTRAVENOUS

## 2022-09-02 MED ORDER — ROCURONIUM BROMIDE 10 MG/ML (PF) SYRINGE
PREFILLED_SYRINGE | INTRAVENOUS | Status: DC | PRN
  Administered 2022-09-02: 60 mg via INTRAVENOUS
  Administered 2022-09-02: 40 mg via INTRAVENOUS
  Administered 2022-09-02: 20 mg via INTRAVENOUS

## 2022-09-02 MED ORDER — TRANEXAMIC ACID-NACL 1000-0.7 MG/100ML-% IV SOLN
1000.0000 mg | INTRAVENOUS | Status: AC
  Administered 2022-09-02: 1000 mg via INTRAVENOUS
  Filled 2022-09-02: qty 100

## 2022-09-02 MED ORDER — FENTANYL CITRATE (PF) 100 MCG/2ML IJ SOLN: 100.0000 ug | Freq: Once | INTRAMUSCULAR | Status: AC

## 2022-09-02 MED ORDER — PROPOFOL 10 MG/ML IV BOLUS
INTRAVENOUS | Status: DC | PRN
  Administered 2022-09-02: 170 mg via INTRAVENOUS

## 2022-09-02 MED ORDER — BUPIVACAINE LIPOSOME 1.3 % IJ SUSP
INTRAMUSCULAR | Status: DC | PRN
  Administered 2022-09-02: 10 mL via PERINEURAL

## 2022-09-02 MED ORDER — PROMETHAZINE HCL 25 MG/ML IJ SOLN: 6.2500 mg | INTRAMUSCULAR | Status: DC | PRN

## 2022-09-02 MED ORDER — ORAL CARE MOUTH RINSE: 15.0000 mL | Freq: Once | OROMUCOSAL | Status: AC

## 2022-09-02 MED ORDER — GABAPENTIN 300 MG PO CAPS
300.0000 mg | ORAL_CAPSULE | Freq: Once | ORAL | Status: AC
  Administered 2022-09-02: 300 mg via ORAL
  Filled 2022-09-02: qty 1

## 2022-09-02 MED ORDER — BUPIVACAINE HCL (PF) 0.5 % IJ SOLN
INTRAMUSCULAR | Status: DC | PRN
  Administered 2022-09-02: 10 mL via PERINEURAL

## 2022-09-02 MED ORDER — SODIUM CHLORIDE 0.9 % IR SOLN
Status: DC | PRN
  Administered 2022-09-02: 3000 mL

## 2022-09-02 MED ORDER — CEFAZOLIN SODIUM-DEXTROSE 2-4 GM/100ML-% IV SOLN
2.0000 g | INTRAVENOUS | Status: AC
  Administered 2022-09-02: 2 g via INTRAVENOUS
  Filled 2022-09-02: qty 100

## 2022-09-02 MED ORDER — SUGAMMADEX SODIUM 200 MG/2ML IV SOLN
INTRAVENOUS | Status: DC | PRN
  Administered 2022-09-02: 200 mg via INTRAVENOUS

## 2022-09-02 MED ORDER — DEXMEDETOMIDINE HCL IN NACL 80 MCG/20ML IV SOLN
INTRAVENOUS | Status: DC | PRN
  Administered 2022-09-02: 6 ug via BUCCAL
  Administered 2022-09-02: 10 ug via BUCCAL

## 2022-09-02 MED ORDER — EPINEPHRINE PF 1 MG/ML IJ SOLN
INTRAMUSCULAR | Status: AC
  Filled 2022-09-02: qty 2

## 2022-09-02 MED ORDER — MIDAZOLAM HCL 2 MG/2ML IJ SOLN
INTRAMUSCULAR | Status: AC
  Administered 2022-09-02: 2 mg via INTRAVENOUS
  Filled 2022-09-02: qty 2

## 2022-09-02 MED ORDER — MIDAZOLAM HCL 2 MG/2ML IJ SOLN
INTRAMUSCULAR | Status: DC | PRN
  Administered 2022-09-02: 2 mg via INTRAVENOUS

## 2022-09-02 MED ORDER — EPINEPHRINE PF 1 MG/ML IJ SOLN
INTRAMUSCULAR | Status: DC | PRN
  Administered 2022-09-02: 2 mL

## 2022-09-02 MED ORDER — ONDANSETRON HCL 4 MG/2ML IJ SOLN
INTRAMUSCULAR | Status: DC | PRN
  Administered 2022-09-02: 4 mg via INTRAVENOUS

## 2022-09-02 MED ORDER — CHLORHEXIDINE GLUCONATE 0.12 % MT SOLN
15.0000 mL | Freq: Once | OROMUCOSAL | Status: AC
  Administered 2022-09-02: 15 mL via OROMUCOSAL
  Filled 2022-09-02: qty 15

## 2022-09-02 MED ORDER — DEXAMETHASONE SODIUM PHOSPHATE 10 MG/ML IJ SOLN
INTRAMUSCULAR | Status: DC | PRN
  Administered 2022-09-02: 5 mg via INTRAVENOUS

## 2022-09-02 MED ORDER — FENTANYL CITRATE (PF) 100 MCG/2ML IJ SOLN
INTRAMUSCULAR | Status: AC
  Administered 2022-09-02: 100 ug via INTRAVENOUS
  Filled 2022-09-02: qty 2

## 2022-09-02 MED ORDER — FENTANYL CITRATE (PF) 250 MCG/5ML IJ SOLN
INTRAMUSCULAR | Status: DC | PRN
  Administered 2022-09-02: 100 ug via INTRAVENOUS

## 2022-09-02 MED ORDER — ACETAMINOPHEN 500 MG PO TABS
1000.0000 mg | ORAL_TABLET | Freq: Once | ORAL | Status: AC
  Administered 2022-09-02: 1000 mg via ORAL
  Filled 2022-09-02: qty 2

## 2022-09-02 SURGICAL SUPPLY — 50 items
ANCHOR SUT 1.8 FIBERTAK SB KL (Anchor) IMPLANT
BAG COUNTER SPONGE SURGICOUNT (BAG) ×1 IMPLANT
BLADE EXCALIBUR 4.0X13 (MISCELLANEOUS) ×1 IMPLANT
BLADE SHAVER TORPEDO 4X13 (MISCELLANEOUS) ×1 IMPLANT
CANNULA 5.75X71 LONG (CANNULA) IMPLANT
CANNULA 8.25X9 (CANNULA) IMPLANT
CANNULA TWIST IN 8.25X7CM (CANNULA) IMPLANT
COVER SURGICAL LIGHT HANDLE (MISCELLANEOUS) ×1 IMPLANT
DISSECTOR 4.0MM X 13CM (MISCELLANEOUS) IMPLANT
DRAPE INCISE IOBAN 66X45 STRL (DRAPES) ×1 IMPLANT
DRAPE STERI 35X30 U-POUCH (DRAPES) ×2 IMPLANT
DRSG TEGADERM 2-3/8X2-3/4 SM (GAUZE/BANDAGES/DRESSINGS) ×1 IMPLANT
DRSG TEGADERM 4X4.75 (GAUZE/BANDAGES/DRESSINGS) IMPLANT
DRSG XEROFORM 1X8 (GAUZE/BANDAGES/DRESSINGS) IMPLANT
DW OUTFLOW CASSETTE/TUBE SET (MISCELLANEOUS) ×1 IMPLANT
GAUZE PAD ABD 7.5X8 STRL (GAUZE/BANDAGES/DRESSINGS) IMPLANT
GAUZE SPONGE 4X4 12PLY STRL (GAUZE/BANDAGES/DRESSINGS) ×1 IMPLANT
GAUZE SPONGE 4X4 12PLY STRL LF (GAUZE/BANDAGES/DRESSINGS) IMPLANT
GAUZE XEROFORM 1X8 LF (GAUZE/BANDAGES/DRESSINGS) ×1 IMPLANT
GLOVE BIOGEL PI IND STRL 6.5 (GLOVE) ×1 IMPLANT
GLOVE BIOGEL PI IND STRL 8 (GLOVE) ×1 IMPLANT
GLOVE ECLIPSE 6.0 STRL STRAW (GLOVE) ×1 IMPLANT
GLOVE INDICATOR 8.0 STRL GRN (GLOVE) ×1 IMPLANT
GOWN STRL REUS W/ TWL LRG LVL3 (GOWN DISPOSABLE) ×2 IMPLANT
GOWN STRL REUS W/TWL LRG LVL3 (GOWN DISPOSABLE) ×2
KIT BASIN OR (CUSTOM PROCEDURE TRAY) ×1 IMPLANT
KIT PERC INSERT 3.0 KNTLS (KITS) IMPLANT
KIT STR SPEAR 1.8 FBRTK DISP (KITS) ×1 IMPLANT
KIT TURNOVER KIT B (KITS) ×1 IMPLANT
LASSO CRESCENT QUICKPASS (SUTURE) IMPLANT
MANIFOLD NEPTUNE II (INSTRUMENTS) ×1 IMPLANT
NDL SPNL 18GX3.5 QUINCKE PK (NEEDLE) ×1 IMPLANT
NDL SUT 2-0 SCORPION KNEE (NEEDLE) ×1 IMPLANT
NEEDLE SPNL 18GX3.5 QUINCKE PK (NEEDLE) ×1 IMPLANT
NEEDLE SUT 2-0 SCORPION KNEE (NEEDLE) ×1 IMPLANT
NS IRRIG 1000ML POUR BTL (IV SOLUTION) ×1 IMPLANT
PACK SHOULDER (CUSTOM PROCEDURE TRAY) ×1 IMPLANT
PAD ARMBOARD 7.5X6 YLW CONV (MISCELLANEOUS) ×2 IMPLANT
PORT APPOLLO RF 90DEGREE MULTI (SURGICAL WAND) IMPLANT
SPONGE T-LAP 4X18 ~~LOC~~+RFID (SPONGE) ×1 IMPLANT
SUT ETHILON 3 0 PS 1 (SUTURE) ×2 IMPLANT
SUT FIBERWIRE 2-0 18 17.9 3/8 (SUTURE)
SUT LASSO 90 DEG CVD (SUTURE) IMPLANT
SUTURE FIBERWR 2-0 18 17.9 3/8 (SUTURE) IMPLANT
SUTURE LASSO 90 DEG CURVED L (MISCELLANEOUS) IMPLANT
SYR 30ML LL (SYRINGE) ×1 IMPLANT
TOWEL GREEN STERILE (TOWEL DISPOSABLE) ×1 IMPLANT
TOWEL GREEN STERILE FF (TOWEL DISPOSABLE) ×1 IMPLANT
TUBING ARTHROSCOPY IRRIG 16FT (MISCELLANEOUS) ×1 IMPLANT
WATER STERILE IRR 1000ML POUR (IV SOLUTION) ×1 IMPLANT

## 2022-09-02 NOTE — Discharge Instructions (Signed)
Discharge Instructions    Attending Surgeon: Vanetta Mulders, MD Office Phone Number: [phone]   Diagnosis and Procedures:    Surgeries Performed: Left shoulder labral repair  Discharge Plan:    Diet: Resume usual diet. Begin with light or bland foods.  Drink plenty of fluids.  Activity:  Keep sling and dressing in place until your follow up visit in Physical Therapy You are advised to go home directly from the hospital or surgical center. Restrict your activities.  GENERAL INSTRUCTIONS: 1.  Keep your surgical site elevated above your heart for at least 5-7 days or longer to prevent swelling. This will improve your comfort and your overall recovery following surgery.     2. Please call Dr. Eddie Dibbles office at [phone] with questions Monday-Friday during business hours. If no one answers, please leave a message and someone should get back to the patient within 24 hours. For emergencies please call 911 or proceed to the emergency room.   3. Patient to notify surgical team if experiences any of the following: Bowel/Bladder dysfunction, uncontrolled pain, nerve/muscle weakness, incision with increased drainage or redness, nausea/vomiting and Fever greater than 101.0 F.  Be alert for signs of infection including redness, streaking, odor, fever or chills. Be alert for excessive pain or bleeding and notify your surgeon immediately.  WOUND INSTRUCTIONS:   Leave your dressing/cast/splint in place until your post operative visit.  Keep it clean and dry.  Always keep the incision clean and dry until the staples/sutures are removed. If there is no drainage from the incision you should keep it open to air. If there is drainage from the incision you must keep it covered at all times until the drainage stops  Do not soak in a bath tub, hot tub, pool, lake or other body of water until 21 days after your surgery and your incision is completely dry and healed.  If you have removable  sutures (or staples) they must be removed 10-14 days (unless otherwise instructed) from the day of your surgery.     1)  Elevate the extremity as much as possible.  2)  Keep the dressing clean and dry.  3)  Please call us if the dressing becomes wet or dirty.  4)  If you are experiencing worsening pain or worsening swelling, please call.     MEDICATIONS: Resume all previous home medications at the previous prescribed dose and frequency unless otherwise noted Start taking the  pain medications on an as-needed basis as prescribed  Please taper down pain medication over the next week following surgery.  Ideally you should not require a refill of any narcotic pain medication.  Take pain medication with food to minimize nausea. In addition to the prescribed pain medication, you may take over-the-counter pain relievers such as Tylenol.  Do NOT take additional tylenol if your pain medication already has tylenol in it.  Aspirin 325mg  daily for four weeks.      FOLLOWUP INSTRUCTIONS: 1. Follow up at the Physical Therapy Clinic 3-4 days following surgery. This appointment should be scheduled unless other arrangements have been made.The Physical Therapy scheduling number is [phone] if an appointment has not already been arranged.  2. Contact Dr. Eddie Dibbles office during office hours at [phone] or the practice after hours line at [phone] for non-emergencies. For medical emergencies call 911.   Discharge Location: Home

## 2022-09-02 NOTE — Anesthesia Preprocedure Evaluation (Addendum)
Anesthesia Evaluation  Patient identified by MRN, date of birth, ID band Patient awake    Reviewed: Allergy & Precautions, NPO status , Patient's Chart, lab work & pertinent test results  History of Anesthesia Complications (+) PONV and history of anesthetic complications  Airway Mallampati: II  TM Distance: >3 FB Neck ROM: Full    Dental  (+) Teeth Intact, Dental Advisory Given   Pulmonary neg pulmonary ROS   Pulmonary exam normal breath sounds clear to auscultation       Cardiovascular negative cardio ROS Normal cardiovascular exam Rhythm:Regular Rate:Normal     Neuro/Psych negative neurological ROS     GI/Hepatic negative GI ROS, Neg liver ROS,,,  Endo/Other  negative endocrine ROS    Renal/GU negative Renal ROS     Musculoskeletal LEFT GLENOHUMERAL INSTABILITY   Abdominal   Peds  (+) ADHD Hematology negative hematology ROS (+)   Anesthesia Other Findings Day of surgery medications reviewed with the patient.  Reproductive/Obstetrics                             Anesthesia Physical Anesthesia Plan  ASA: 2  Anesthesia Plan: General   Post-op Pain Management: Tylenol PO (pre-op)*, Regional block* and Gabapentin PO (pre-op)*   Induction: Intravenous  PONV Risk Score and Plan: 3 and Midazolam, Dexamethasone and Ondansetron  Airway Management Planned: Oral ETT  Additional Equipment:   Intra-op Plan:   Post-operative Plan: Extubation in OR  Informed Consent: I have reviewed the patients History and Physical, chart, labs and discussed the procedure including the risks, benefits and alternatives for the proposed anesthesia with the patient or authorized representative who has indicated his/her understanding and acceptance.     Dental advisory given  Plan Discussed with: CRNA  Anesthesia Plan Comments:         Anesthesia Quick Evaluation

## 2022-09-02 NOTE — Transfer of Care (Signed)
Immediate Anesthesia Transfer of Care Note  Patient: Eric Tapia  Procedure(s) Performed: LEFT SHOULDER ARTHROSCOPY WITH LABRAL REPAIR (Left: Shoulder)  Patient Location: PACU  Anesthesia Type:General and Regional  Level of Consciousness: drowsy  Airway & Oxygen Therapy: Patient Spontanous Breathing and Patient connected to face mask oxygen  Post-op Assessment: Report given to RN, Post -op Vital signs reviewed and stable, and Patient moving all extremities  Post vital signs: Reviewed and stable  Last Vitals:  Vitals Value Taken Time  BP 104/47 09/02/22 1710  Temp 36.3 C 09/02/22 1710  Pulse 54 09/02/22 1711  Resp 12 09/02/22 1711  SpO2 100 % 09/02/22 1711  Vitals shown include unvalidated device data.  Last Pain:  Vitals:   09/02/22 1241  TempSrc:   PainSc: 4          Complications: No notable events documented.

## 2022-09-02 NOTE — Brief Op Note (Signed)
   Brief Op Note  Date of Surgery: 09/02/2022  Preoperative Diagnosis: LEFT GLENOHUMERAL INSTABILITY  Postoperative Diagnosis: same  Procedure: Procedure(s): LEFT SHOULDER ARTHROSCOPY WITH LABRAL REPAIR  Implants: Implant Name Type Inv. Item Serial No. Manufacturer Lot No. LRB No. Used Action  ANCHOR SUT 1.8 [employer]IBERT[employer] SB KL - [id number] Anchor ANCHOR SUT 1.8 [employer]IBERTAK SB KL  [employer] [id number] Left 1 Implanted  ANCHOR SUT 1.8 FIBERTAK SB KL - [id number] Anchor ANCHOR SUT 1.8 [employer] SB KL  [employer] [id number] Left 1 Implanted  ANCHOR SUT 1.8 FIBERTAK SB KL - [id number] Anchor ANCHOR SUT 1.8 [employer]IBERTAK SB KL  [employer] [id number] Left 1 Implanted  ANCHOR SUT 1.8 FIBERTAK SB KL - [id number] Anchor ANCHOR SUT 1.8 FIBERTAK SB KL  [employer] [id number] Left 1 Implanted  ANCHOR SUT 1.8 FIBERTAK SB KL - [id number] Anchor ANCHOR SUT 1.8 [employer] [id number] Left 1 Implanted    Surgeons: Surgeon(s): Vanetta Mulders, MD  Anesthesia: Regional    Estimated Blood Loss: See anesthesia record  Complications: None  Condition to PACU: Stable  Yevonne Pax, MD 09/02/2022 4:48 PM

## 2022-09-02 NOTE — Anesthesia Procedure Notes (Signed)
Procedure Name: Intubation Date/Time: 09/02/2022 3:11 PM  Performed by: Elvin So, CRNAPre-anesthesia Checklist: Patient identified, Emergency Drugs available, Suction available and Patient being monitored Patient Re-evaluated:Patient Re-evaluated prior to induction Oxygen Delivery Method: Circle System Utilized Preoxygenation: Pre-oxygenation with 100% oxygen Induction Type: IV induction Ventilation: Mask ventilation without difficulty Laryngoscope Size: Mac and 4 Grade View: Grade I Tube type: Oral Tube size: 7.5 mm Number of attempts: 1 Airway Equipment and Method: Stylet and Oral airway Placement Confirmation: ETT inserted through vocal cords under direct vision, positive ETCO2 and breath sounds checked- equal and bilateral Secured at: 23 cm Tube secured with: Tape Dental Injury: Teeth and Oropharynx as per pre-operative assessment

## 2022-09-02 NOTE — Interval H&P Note (Signed)
History and Physical Interval Note:  09/02/2022 2:53 PM  Eric Tapia  has presented today for surgery, with the diagnosis of LEFT GLENOHUMERAL INSTABILITY.  The various methods of treatment have been discussed with the patient and family. After consideration of risks, benefits and other options for treatment, the patient has consented to  Procedure(s): LEFT SHOULDER ARTHROSCOPY WITH LABRAL REPAIR (Left) as a surgical intervention.  The patient's history has been reviewed, patient examined, no change in status, stable for surgery.  I have reviewed the patient's chart and labs.  Questions were answered to the patient's satisfaction.     Vanetta Mulders

## 2022-09-02 NOTE — Op Note (Addendum)
Date of Surgery: 09/02/2022  INDICATIONS: Eric Tapia is a 22 y.o.-year-old male with left shoulder tear.  The risk and benefits of the procedure were discussed in detail and documented in the pre-operative evaluation.   PREOPERATIVE DIAGNOSIS: 1. Left shoulder labral tear  POSTOPERATIVE DIAGNOSIS: Same.  PROCEDURE: 1. Left shoulder inferior labral repair 2. Left shoulder limited debridement  SURGEON: Eric Pax MD  ASSISTANT: Raynelle Fanning, ATC  ANESTHESIA:  general  IV FLUIDS AND URINE: See anesthesia record.  ANTIBIOTICS: Ancef  ESTIMATED BLOOD LOSS: 10 mL.  IMPLANTS:  Implant Name Type Inv. Item Serial No. Manufacturer Lot No. LRB No. Used Action  ANCHOR SUT 1.8 FIBERTAK SB KL - [id number] Anchor ANCHOR SUT 1.8 FIBERTAK SB KL  [employer] [id number] Left 1 Implanted  ANCHOR SUT 1.8 FIBERTAK SB KL - [id number] Anchor ANCHOR SUT 1.8 FIBERTAK SB KL  [employer] [id number] Left 1 Implanted  ANCHOR SUT 1.8 FIBERTAK SB KL - [id number] Anchor ANCHOR SUT 1.8 FIBERTAK SB KL  [employer] [id number] Left 1 Implanted  ANCHOR SUT 1.8 FIBERTAK SB KL - [id number] Anchor ANCHOR SUT 1.8 FIBERTAK SB KL  [employer] [id number] Left 1 Implanted  ANCHOR SUT 1.8 FIBERTAK SB KL - [id number] Anchor ANCHOR SUT 1.8 FIBERTAK SB KL  [employer] [id number] Left 1 Implanted    DRAINS: None  CULTURES: None  COMPLICATIONS: none  DESCRIPTION OF PROCEDURE:  Examination under anesthesia revealed forward elevation of 165 degrees.  In abduction, there was 90 degrees of external rotation and 70 degrees of internal rotation.  With the arm at the side, there was 70 degrees of external rotation.  There is a 2+ anterior load shift and a 2+ posterior load shift with palpable click as the humerus moved over the glenoid anteriorly.  Arthroscopic findings demonstrated:  Glenoid cartilage: Normal Humeral head: Normal Labrum:  labral tear from 3 o'clock to 9 o'clock Biceps insertion: Intact Biceps tendon:  Intact Subscapularis insertion: Normal Rotator cuff: Normal  The patient was identified in the preoperative holding area.  The correct site was marked according to universal protocol.  Anesthesia performed an interscalene nerve block.  Ancef was given 1 hour prior to skin incision.  The patient was subsequently taken back to the operating room.  The patient was prepped and draped and positioned in the lateral position.  All bony prominences were padded.  Final timeout was performed.  Standard posterior, anterior and anterosuperolateral portals were utilized. The posterior portal was created with an 11-blade and the arthroscope introduced into the glenohumeral joint.  A full diagnostic arthroscopy was performed as described above.  A low anterior portal just above the rolled border of the subscapularis was identified with a spinal needle, and then instrumenting cannula was placed.  An anterosuperolateral viewing portal was localized with a spinal needle just posterior to the biceps tendon, and the arthroscope was transferred to this portal.  A posterior portal was also placed for instrumentation.  First, I directed my attention to preparation of the glenoid, labrum and capsule for repair.  The elevator was used to elevate off the injured labrum from the glenoid rim.  A shaver was subsequently introduced and used on forward in order to create bleeding bony bed for the labrum to heal back to.  Debridement was performed of the posterior and inferior labrum with combination of electrocautery and shaver. Debridement was done at the biceps anchor in the rotator interval.  Next, I sequentially repaired the capsule and labrum from the 3:00  position to the 9:00 position with a total of 4 anchors.  These were all suture knotless anchors as noted above.  Anchors were placed at the 8:30, 6:30, 4:30, 3:00 positions.  At each location, a pilot hole was drilled, the anchor inserted and deployed.  Then, one limb of suture  was shuttled around the labrum and capsule using a suture lasso, taking care to provide both medial to lateral and inferior to superior shift of the tissues.  This was subsequently fed into the knotless mechanism and tensioned.  This was done sequentially for all additional anchors.  Once completed, the labrum was restored to an anatomic position, and tension was restored to both bands of the IGHL.  The inferior capsular volume was normalized and the humeral head was centered on the glenoid.   All instruments were removed, fluid was evacuated, and the arthroscopy portals were closed with 3-0 nylon.  A sterile dressing was applied with Xeroform, gauze, ABD and Medipore tape followed by a Iceman device and a sling with an abduction pillow.    The patient awoke from anesthesia without difficulty and was transferred to PACU in stable condition.      POSTOPERATIVE PLAN: He will be nonweightbearing until he begins physical therapy.  At that point he will begin the labral repair protocol.  I will take out the sutures at 2 weeks.  Eric Pax, MD 4:49 PM

## 2022-09-02 NOTE — Anesthesia Procedure Notes (Signed)
Anesthesia Regional Block: Interscalene brachial plexus block   Pre-Anesthetic Checklist: , timeout performed,  Correct Patient, Correct Site, Correct Laterality,  Correct Procedure, Correct Position, site marked,  Risks and benefits discussed,  Surgical consent,  Pre-op evaluation,  At surgeon's request and post-op pain management  Laterality: Left  Prep: chloraprep       Needles:  Injection technique: Single-shot  Needle Type: Echogenic Needle     Needle Length: 9cm  Needle Gauge: 21     Additional Needles:   Procedures:,,,, ultrasound used (permanent image in chart),,    Narrative:  Start time: 09/02/2022 1:35 PM End time: 09/02/2022 1:45 PM Injection made incrementally with aspirations every 5 mL.  Performed by: Personally  Anesthesiologist: Santa Lighter, MD  Additional Notes: No pain on injection. No increased resistance to injection. Injection made in 5cc increments.  Good needle visualization.  Patient tolerated procedure well.

## 2022-09-03 ENCOUNTER — Encounter (HOSPITAL_COMMUNITY): Payer: Self-pay | Admitting: Orthopaedic Surgery

## 2022-09-03 NOTE — Anesthesia Postprocedure Evaluation (Signed)
Anesthesia Post Note  Patient: Eric Tapia  Procedure(s) Performed: LEFT SHOULDER ARTHROSCOPY WITH LABRAL REPAIR (Left: Shoulder)     Patient location during evaluation: PACU Anesthesia Type: Regional and General Level of consciousness: awake and alert Pain management: pain level controlled Vital Signs Assessment: post-procedure vital signs reviewed and stable Respiratory status: spontaneous breathing, nonlabored ventilation, respiratory function stable and patient connected to nasal cannula oxygen Cardiovascular status: blood pressure returned to baseline and stable Postop Assessment: no apparent nausea or vomiting Anesthetic complications: no   No notable events documented.  Last Vitals:  Vitals:   09/02/22 1730 09/02/22 1745  BP: 129/65 (!) 143/85  Pulse: (!) 57 72  Resp: 16 (!) 21  Temp:  (!) 36.3 C  SpO2: 100% 100%    Last Pain:  Vitals:   09/02/22 1745  TempSrc:   PainSc: 0-No pain                 Santa Lighter

## 2022-09-06 ENCOUNTER — Encounter (HOSPITAL_BASED_OUTPATIENT_CLINIC_OR_DEPARTMENT_OTHER): Payer: Self-pay | Admitting: Physical Therapy

## 2022-09-06 ENCOUNTER — Ambulatory Visit (HOSPITAL_BASED_OUTPATIENT_CLINIC_OR_DEPARTMENT_OTHER): Payer: Medicaid Other | Attending: Orthopaedic Surgery | Admitting: Physical Therapy

## 2022-09-06 DIAGNOSIS — M6281 Muscle weakness (generalized): Secondary | ICD-10-CM | POA: Diagnosis not present

## 2022-09-06 DIAGNOSIS — M25612 Stiffness of left shoulder, not elsewhere classified: Secondary | ICD-10-CM | POA: Diagnosis not present

## 2022-09-06 DIAGNOSIS — M25512 Pain in left shoulder: Secondary | ICD-10-CM | POA: Diagnosis not present

## 2022-09-06 DIAGNOSIS — S43015A Anterior dislocation of left humerus, initial encounter: Secondary | ICD-10-CM | POA: Diagnosis not present

## 2022-09-06 NOTE — Therapy (Signed)
OUTPATIENT PHYSICAL THERAPY UPPER EXTREMITY EVALUATION   Patient Name: Eric Tapia MRN: [id number] DOB:08/09/2000, 22 y.o., male Today's Date: 09/08/2022  END OF SESSION:  PT End of Session - 09/08/22 1815     Visit Number 1    Number of Visits 16    Date for PT Re-Evaluation 11/01/22    PT Start Time 1430    PT Stop Time 1513    PT Time Calculation (min) 43 min    Activity Tolerance Patient tolerated treatment well    Behavior During Therapy WFL for tasks assessed/performed             Past Medical History:  Diagnosis Date   Attention deficit hyperactivity disorder (ADHD)    Epistaxis    PONV (postoperative nausea and vomiting)    Past Surgical History:  Procedure Laterality Date   NOSE SURGERY     SHOULDER ARTHROSCOPY WITH LABRAL REPAIR Left 09/02/2022   Procedure: LEFT SHOULDER ARTHROSCOPY WITH LABRAL REPAIR;  Surgeon: Vanetta Mulders, MD;  Location: [facility];  Service: Orthopedics;  Laterality: Left;   Patient Active Problem List   Diagnosis Date Noted   Anterior dislocation of left shoulder 09/02/2022    PCP: No PCP   REFERRING PROVIDER: Dr Vanetta Mulders   REFERRING DIAG:   [id number] (ICD-10-CM) - Anterior dislocation of left shoulder, initial encounter    THERAPY DIAG:  Stiffness of left shoulder, not elsewhere classified  Acute pain of left shoulder  Muscle weakness (generalized)  Rationale for Evaluation and Treatment: Rehabilitation  ONSET DATE: 09/02/2022  SUBJECTIVE:                                                                                                                                                                                      SUBJECTIVE STATEMENT: Eric Tapia is a 22 y.o. male left hand dominant male presents with a left anterior shoulder dislocation while he was playing baseball on July 30 and going away out for catching outfield.  He states that several days later he was playing in bed and felt the shoulder again pop out  and relocate when he rolled onto it. He had a labral repair surgery on 09/02/2022. His pain is well controlled at this time. He has been complaint with  sling use.   PERTINENT HISTORY: None   PAIN:  Are you having pain? Yes: NPRS scale: 4-5/10 8/10  Pain location: left shoulder  Pain description: aching  Aggravating factors: just hurts at times  Relieving factors: ice   PRECAUTIONS: Shoulder follow instability protocol   WEIGHT BEARING RESTRICTIONS: Yes NWB   FALLS:  Has patient fallen in last 6 months? 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[employer]   PLOF: Independent  PATIENT GOALS:  To have less pain   NEXT MD VISIT:  2 weeks  OBJECTIVE:   DIAGNOSTIC FINDINGS:  Nothing post op   PATIENT SURVEYS :  FOTO    COGNITION: Overall cognitive status: Within functional limits for tasks assessed     SENSATION: WFL  POSTURE: WNL   UPPER EXTREMITY ROM:   Passive ROM Right eval Left eval  Shoulder flexion  75  Shoulder extension    Shoulder abduction    Shoulder adduction    Shoulder internal rotation  To belly   Shoulder external rotation  6  Elbow flexion  WNL   Elbow extension    Wrist flexion    Wrist extension    Wrist ulnar deviation    Wrist radial deviation    Wrist pronation    Wrist supination    (Blank rows = not tested)  UPPER EXTREMITY MMT:  MMT Right eval Left eval  Shoulder flexion    Shoulder extension    Shoulder abduction    Shoulder adduction    Shoulder internal rotation    Shoulder external rotation    Middle trapezius    Lower trapezius    Elbow flexion    Elbow extension    Wrist flexion    Wrist extension    Wrist ulnar deviation    Wrist radial deviation    Wrist pronation    Wrist supination    Grip strength (lbs)    (Blank rows = not tested) Not tested 2nd to recent surgery   S JOINT MOBILITY TESTING:    PALPATION:  No unexpected tenderness to  palpation   TODAY'S TREATMENT:                                                                                                                                         DATE:Access Code: [email or web address] URL: [email or web address] Date: 09/08/2022 Prepared by: Carolyne Littles  Exercises - Circular Shoulder Pendulum with Table Support  - 3 x daily - 7 x weekly - 3 sets - 10 reps - Seated Scapular Retraction  - 3 x daily - 7 x weekly - 3 sets - 10 reps - Seated Elbow Flexion AAROM  - 3 x daily - 7 x weekly - 3 sets - 10 reps - Tip Pinch with Putty  - 3 x daily - 7 x weekly - 3 sets - 10 reps - Thumb MCP and IP Flexion with Putty  - 3 x daily - 7 x weekly - 3 sets - 10 reps - Key Pinch with Putty  - 3 x daily - 7 x weekly - 3 sets - 10 reps   PATIENT EDUCATION: Education details: HEP; progression of activity  Person educated: Patient Education method: Explanation, Demonstration, Tactile cues, Verbal cues, and  Handouts Education comprehension: verbalized understanding, returned demonstration, verbal cues required, tactile cues required, and needs further education  HOME EXERCISE PROGRAM: Access Code: [email or web address] URL: [email or web address] Date: 09/08/2022 Prepared by: Carolyne Littles  Exercises - Circular Shoulder Pendulum with Table Support  - 3 x daily - 7 x weekly - 3 sets - 10 reps - Seated Scapular Retraction  - 3 x daily - 7 x weekly - 3 sets - 10 reps - Seated Elbow Flexion AAROM  - 3 x daily - 7 x weekly - 3 sets - 10 reps - Tip Pinch with Putty  - 3 x daily - 7 x weekly - 3 sets - 10 reps - Thumb MCP and IP Flexion with Putty  - 3 x daily - 7 x weekly - 3 sets - 10 reps - Key Pinch with Putty  - 3 x daily - 7 x weekly - 3 sets - 10 reps  ASSESSMENT:  CLINICAL IMPRESSION: Patient is a 22 year old male status post left anterior labrum repair on 09/02/2022.  At this time he is doing very well.  He has expected limitations in motion strength and functional movement  of the arm.  He has been compliant with sling use.  His goal is to get back to baseball.  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  He would benefit from further skilled therapy to return to higher functional level. OBJECTIVE IMPAIRMENTS: decreased knowledge of condition, decreased ROM, decreased strength, impaired UE functional use, and pain.   ACTIVITY LIMITATIONS: carrying, lifting, self feeding, reach over head, and hygiene/grooming  PARTICIPATION LIMITATIONS: meal prep, cleaning, laundry, driving, shopping, community activity, occupation, and school  PERSONAL FACTORS: None .   REHAB POTENTIAL: Excellent  CLINICAL DECISION MAKING: Stable/uncomplicated  EVALUATION COMPLEXITY: Low  GOALS: Goals reviewed with patient? Yes  SHORT TERM GOALS: Target date: 10/18/2022    Patient will increase passive external rotation of the left shoulder to 30 degrees Baseline: Goal status: INITIAL  2.  Patient will increase passive left shoulder flexion to 90 degrees Baseline:  Goal status: INITIAL  3.  Patient will wean out of the sling without significant increase in pain Baseline:  Goal status: INITIAL  4.  Patient will be admitted independent with initial exercise program Baseline:  Goal status: INITIAL LONG TERM GOALS: Target date: 11/29/2022    Patient will begin return to throwing program per MD Baseline:  Goal status: INITIAL  2.  Patient will return to base gym program without pain Baseline:  Goal status: INITIAL  3.  Patient will perform all ADLs and IADLs with dominant arm without increased pain Baseline:  Goal status: INITIAL  PLAN: PT FREQUENCY: 2x/week  PT DURATION: 6 weeks  PLANNED INTERVENTIONS: Therapeutic exercises, Therapeutic activity, Neuromuscular re-education, Patient/Family education, Self Care, Joint mobilization, Aquatic Therapy, Dry Needling, Electrical stimulation, Cryotherapy, Moist heat, Taping, Ultrasound, and Manual therapy  PLAN FOR NEXT SESSION:  begin per protocol    Carney Living, PT 09/08/2022, 6:18 PM

## 2022-09-11 ENCOUNTER — Encounter (HOSPITAL_BASED_OUTPATIENT_CLINIC_OR_DEPARTMENT_OTHER): Payer: Medicaid Other | Admitting: Orthopaedic Surgery

## 2022-09-13 ENCOUNTER — Ambulatory Visit (HOSPITAL_BASED_OUTPATIENT_CLINIC_OR_DEPARTMENT_OTHER): Payer: Medicaid Other

## 2022-09-13 ENCOUNTER — Encounter (HOSPITAL_BASED_OUTPATIENT_CLINIC_OR_DEPARTMENT_OTHER): Payer: Self-pay

## 2022-09-13 DIAGNOSIS — M25512 Pain in left shoulder: Secondary | ICD-10-CM | POA: Diagnosis not present

## 2022-09-13 DIAGNOSIS — M6281 Muscle weakness (generalized): Secondary | ICD-10-CM

## 2022-09-13 DIAGNOSIS — S43015A Anterior dislocation of left humerus, initial encounter: Secondary | ICD-10-CM | POA: Diagnosis not present

## 2022-09-13 DIAGNOSIS — M25612 Stiffness of left shoulder, not elsewhere classified: Secondary | ICD-10-CM | POA: Diagnosis not present

## 2022-09-13 NOTE — Therapy (Signed)
OUTPATIENT PHYSICAL THERAPY UPPER EXTREMITY EVALUATION   Patient Name: Eric Tapia MRN: [id number] DOB:[birth year], 22 y.o., male Today's Date: 09/13/2022  END OF SESSION:  PT End of Session - 09/13/22 1431     Visit Number 2    Number of Visits 16    Date for PT Re-Evaluation 11/01/22    PT Start Time 1432    PT Stop Time 1510    PT Time Calculation (min) 38 min    Activity Tolerance Patient tolerated treatment well    Behavior During Therapy WFL for tasks assessed/performed              Past Medical History:  Diagnosis Date   Attention deficit hyperactivity disorder (ADHD)    Epistaxis    PONV (postoperative nausea and vomiting)    Past Surgical History:  Procedure Laterality Date   NOSE SURGERY     SHOULDER ARTHROSCOPY WITH LABRAL REPAIR Left 09/02/2022   Procedure: LEFT SHOULDER ARTHROSCOPY WITH LABRAL REPAIR;  Surgeon: Vanetta Mulders, MD;  Location: [facility];  Service: Orthopedics;  Laterality: Left;   Patient Active Problem List   Diagnosis Date Noted   Anterior dislocation of left shoulder 09/02/2022    PCP: No PCP   REFERRING PROVIDER: Dr Vanetta Mulders   REFERRING DIAG:   [phone] (ICD-10-CM) - Anterior dislocation of left shoulder, initial encounter    THERAPY DIAG:  Stiffness of left shoulder, not elsewhere classified  Acute pain of left shoulder  Muscle weakness (generalized)  Rationale for Evaluation and Treatment: Rehabilitation  ONSET DATE: 09/02/2022  SUBJECTIVE:                                                                                                                                                                                     Pt arrives without sling, stating he has been going without it most of the time. He reports taking only ibuprophen occasionally. Has been sleeping well overall.  SUBJECTIVE STATEMENT:  PERTINENT HISTORY: None   PAIN:  Are you having pain? Yes: NPRS scale: 4-5/10 8/10  Pain location: left shoulder   Pain description: aching  Aggravating factors: just hurts at times  Relieving factors: ice   PRECAUTIONS: Shoulder follow instability protocol   WEIGHT BEARING RESTRICTIONS: Yes NWB   FALLS:  Has patient fallen in last 6 months? 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  PATIENT GOALS:  To have less pain   NEXT MD VISIT:  2 weeks  OBJECTIVE:   DIAGNOSTIC FINDINGS:  Nothing post op   PATIENT SURVEYS :  FOTO    COGNITION:  Overall cognitive status: Within functional limits for tasks assessed     SENSATION: WFL  POSTURE: WNL   UPPER EXTREMITY ROM:   Passive ROM Right eval Left eval  Shoulder flexion  75  Shoulder extension    Shoulder abduction    Shoulder adduction    Shoulder internal rotation  To belly   Shoulder external rotation  6  Elbow flexion  WNL   Elbow extension    Wrist flexion    Wrist extension    Wrist ulnar deviation    Wrist radial deviation    Wrist pronation    Wrist supination    (Blank rows = not tested)  UPPER EXTREMITY MMT:  MMT Right eval Left eval  Shoulder flexion    Shoulder extension    Shoulder abduction    Shoulder adduction    Shoulder internal rotation    Shoulder external rotation    Middle trapezius    Lower trapezius    Elbow flexion    Elbow extension    Wrist flexion    Wrist extension    Wrist ulnar deviation    Wrist radial deviation    Wrist pronation    Wrist supination    Grip strength (lbs)    (Blank rows = not tested) Not tested 2nd to recent surgery   S JOINT MOBILITY TESTING:    PALPATION:  No unexpected tenderness to palpation   TODAY'S TREATMENT:                                                                                                                                         DATE:  2/9: -PROM L shoulder -Supine chest press with cane x10 -scap retraction-5" 2x10 -Elbow flex/ext 2#  2x10 -Ball rolls at table (physioball)- 2x10 flex and abd -Shoulder isometrics- Flex, abd, ER 5" x10 ea  Eval: Access Code: [email or web address] URL: [email or web address] Date: 09/08/2022 Prepared by: Carolyne Littles  Exercises - Circular Shoulder Pendulum with Table Support  - 3 x daily - 7 x weekly - 3 sets - 10 reps - Seated Scapular Retraction  - 3 x daily - 7 x weekly - 3 sets - 10 reps - Seated Elbow Flexion AAROM  - 3 x daily - 7 x weekly - 3 sets - 10 reps - Tip Pinch with Putty  - 3 x daily - 7 x weekly - 3 sets - 10 reps - Thumb MCP and IP Flexion with Putty  - 3 x daily - 7 x weekly - 3 sets - 10 reps - Key Pinch with Putty  - 3 x daily - 7 x weekly - 3 sets - 10 reps   PATIENT EDUCATION: Education details: HEP; progression of activity  Person educated: Patient Education method: Explanation, Demonstration, Tactile cues, Verbal cues, and Handouts Education comprehension: verbalized understanding, returned demonstration, verbal cues required, tactile cues required, and needs further education  HOME  EXERCISE PROGRAM: Access Code: [email or web address] URL: [email or web address] Date: 09/08/2022 Prepared by: Carolyne Littles  Exercises - Circular Shoulder Pendulum with Table Support  - 3 x daily - 7 x weekly - 3 sets - 10 reps - Seated Scapular Retraction  - 3 x daily - 7 x weekly - 3 sets - 10 reps - Seated Elbow Flexion AAROM  - 3 x daily - 7 x weekly - 3 sets - 10 reps - Tip Pinch with Putty  - 3 x daily - 7 x weekly - 3 sets - 10 reps - Thumb MCP and IP Flexion with Putty  - 3 x daily - 7 x weekly - 3 sets - 10 reps - Key Pinch with Putty  - 3 x daily - 7 x weekly - 3 sets - 10 reps  ASSESSMENT:  CLINICAL IMPRESSION: Reviewed precautions and healing timeline with pt. Emphasized importance of remaining within pain limitations with HEP. Educated on sling use. Able to initiate gentle AAROM tasks with no significant discomfort. Also trialed isometric strengthening today  and added to HEP. Educated about DOMS and using ice. Will continue to progress with protocol as tolerated. Pt has f/u on Thursday next week. OBJECTIVE IMPAIRMENTS: decreased knowledge of condition, decreased ROM, decreased strength, impaired UE functional use, and pain.   ACTIVITY LIMITATIONS: carrying, lifting, self feeding, reach over head, and hygiene/grooming  PARTICIPATION LIMITATIONS: meal prep, cleaning, laundry, driving, shopping, community activity, occupation, and school  PERSONAL FACTORS: None .   REHAB POTENTIAL: Excellent  CLINICAL DECISION MAKING: Stable/uncomplicated  EVALUATION COMPLEXITY: Low  GOALS: Goals reviewed with patient? Yes  SHORT TERM GOALS: Target date: 10/18/2022    Patient will increase passive external rotation of the left shoulder to 30 degrees Baseline: Goal status: INITIAL  2.  Patient will increase passive left shoulder flexion to 90 degrees Baseline:  Goal status: INITIAL  3.  Patient will wean out of the sling without significant increase in pain Baseline:  Goal status: INITIAL  4.  Patient will be admitted independent with initial exercise program Baseline:  Goal status: INITIAL LONG TERM GOALS: Target date: 11/29/2022    Patient will begin return to throwing program per MD Baseline:  Goal status: INITIAL  2.  Patient will return to base gym program without pain Baseline:  Goal status: INITIAL  3.  Patient will perform all ADLs and IADLs with dominant arm without increased pain Baseline:  Goal status: INITIAL  PLAN: PT FREQUENCY: 2x/week  PT DURATION: 6 weeks  PLANNED INTERVENTIONS: Therapeutic exercises, Therapeutic activity, Neuromuscular re-education, Patient/Family education, Self Care, Joint mobilization, Aquatic Therapy, Dry Needling, Electrical stimulation, Cryotherapy, Moist heat, Taping, Ultrasound, and Manual therapy  PLAN FOR NEXT SESSION: begin per protocol    Graceann Congress Philomena Buttermore, PTA 09/13/2022, 3:32 PM

## 2022-09-19 ENCOUNTER — Ambulatory Visit (INDEPENDENT_AMBULATORY_CARE_PROVIDER_SITE_OTHER): Payer: Medicaid Other | Admitting: Orthopaedic Surgery

## 2022-09-19 DIAGNOSIS — S43015A Anterior dislocation of left humerus, initial encounter: Secondary | ICD-10-CM

## 2022-09-19 NOTE — Progress Notes (Signed)
Post Operative Evaluation    Procedure/Date of Surgery: Left shoulder arthroscopic labral repair 1/29  Interval History:    Presents today 2 weeks status post the above procedure.  Overall he is doing extremely well.  He has no pain in the shoulder.  Denies any feelings of instability.  He has been working with physical therapy to work on passive range of motion.   PMH/PSH/Family History/Social History/Meds/Allergies:    Past Medical History:  Diagnosis Date   Attention deficit hyperactivity disorder (ADHD)    Epistaxis    PONV (postoperative nausea and vomiting)    Past Surgical History:  Procedure Laterality Date   NOSE SURGERY     SHOULDER ARTHROSCOPY WITH LABRAL REPAIR Left 09/02/2022   Procedure: LEFT SHOULDER ARTHROSCOPY WITH LABRAL REPAIR;  Surgeon: Vanetta Mulders, MD;  Location: [place];  Service: Orthopedics;  Laterality: Left;   Social History   Socioeconomic History   Marital status: Single    Spouse name: Not on file   Number of children: Not on file   Years of education: Not on file   Highest education level: Not on file  Occupational History   Not on file  Tobacco Use   Smoking status: Never    Passive exposure: Yes   Smokeless tobacco: Never  Vaping Use   Vaping Use: Not on file  Substance and Sexual Activity   Alcohol use: No   Drug use: No   Sexual activity: Not Currently  Other Topics Concern   Not on file  Social History Narrative   Not on file   Social Determinants of Health   Financial Resource Strain: Not on file  Food Insecurity: Not on file  Transportation Needs: Not on file  Physical Activity: Not on file  Stress: Not on file  Social Connections: Not on file   No family history on file. No Known Allergies Current Outpatient Medications  Medication Sig Dispense Refill   acetaminophen (TYLENOL) 500 MG tablet Take 1,000 mg by mouth every 6 (six) hours as needed for moderate pain.     aspirin EC  325 MG tablet Take 1 tablet (325 mg total) by mouth daily. 30 tablet 0   ibuprofen (ADVIL) 200 MG tablet Take 400 mg by mouth every 6 (six) hours as needed for moderate pain.     Melatonin 10 MG TABS Take 10 mg by mouth at bedtime as needed (sleep).     Menthol, Topical Analgesic, (MINERAL ICE EX) Apply 1 Application topically daily as needed (pain).     oxyCODONE (OXY IR/ROXICODONE) 5 MG immediate release tablet Take 1 tablet (5 mg total) by mouth every 4 (four) hours as needed (severe pain). 10 tablet 0   No current facility-administered medications for this visit.   No results found.  Review of Systems:   A ROS was performed including pertinent positives and negatives as documented in the HPI.   Musculoskeletal Exam:    Left shoulder incisions are well-appearing without erythema or drainage.  The spine position is able to forward elevate to 90 degrees and externally rotates 30 degrees.  Internal rotation deferred today. 2+ radial pulse  Imaging:      I personally reviewed and interpreted the radiographs.   Assessment:   2 weeks status post left shoulder arthroscopic labral repair overall doing very well.  I do believe he would be a candidate for expedited rehab with active range of motion of 4 weeks.  All limitations and restrictions were discussed.  I will see him back in 4 weeks  Plan :    -Return to clinic in 4 weeks      I personally saw and evaluated the patient, and participated in the management and treatment plan.  Vanetta Mulders, MD Attending Physician, Orthopedic Surgery  This document was dictated using Dragon voice recognition software. A reasonable attempt at proof reading has been made to minimize errors.

## 2022-09-20 ENCOUNTER — Encounter (HOSPITAL_BASED_OUTPATIENT_CLINIC_OR_DEPARTMENT_OTHER): Payer: Self-pay

## 2022-09-20 ENCOUNTER — Ambulatory Visit (HOSPITAL_BASED_OUTPATIENT_CLINIC_OR_DEPARTMENT_OTHER): Payer: Medicaid Other

## 2022-09-20 DIAGNOSIS — M25512 Pain in left shoulder: Secondary | ICD-10-CM

## 2022-09-20 DIAGNOSIS — M25612 Stiffness of left shoulder, not elsewhere classified: Secondary | ICD-10-CM | POA: Diagnosis not present

## 2022-09-20 DIAGNOSIS — S43015A Anterior dislocation of left humerus, initial encounter: Secondary | ICD-10-CM | POA: Diagnosis not present

## 2022-09-20 DIAGNOSIS — M6281 Muscle weakness (generalized): Secondary | ICD-10-CM | POA: Diagnosis not present

## 2022-09-20 NOTE — Therapy (Signed)
OUTPATIENT PHYSICAL THERAPY UPPER EXTREMITY EVALUATION   Patient Name: Eric Tapia MRN: [id number] DOB:2000-11-04, 22 y.o., male Today's Date: 09/20/2022  END OF SESSION:  PT End of Session - 09/20/22 1431     Visit Number 3    Number of Visits 16    Date for PT Re-Evaluation 11/01/22    PT Start Time 1432    PT Stop Time 1515    PT Time Calculation (min) 43 min    Activity Tolerance Patient tolerated treatment well    Behavior During Therapy WFL for tasks assessed/performed              Past Medical History:  Diagnosis Date   Attention deficit hyperactivity disorder (ADHD)    Epistaxis    PONV (postoperative nausea and vomiting)    Past Surgical History:  Procedure Laterality Date   NOSE SURGERY     SHOULDER ARTHROSCOPY WITH LABRAL REPAIR Left 09/02/2022   Procedure: LEFT SHOULDER ARTHROSCOPY WITH LABRAL REPAIR;  Surgeon: Vanetta Mulders, MD;  Location: [place];  Service: Orthopedics;  Laterality: Left;   Patient Active Problem List   Diagnosis Date Noted   Anterior dislocation of left shoulder 09/02/2022    PCP: No PCP   REFERRING PROVIDER: Dr Vanetta Mulders   REFERRING DIAG:   [id number] (ICD-10-CM) - Anterior dislocation of left shoulder, initial encounter    THERAPY DIAG:  Stiffness of left shoulder, not elsewhere classified  Acute pain of left shoulder  Muscle weakness (generalized)  Rationale for Evaluation and Treatment: Rehabilitation  ONSET DATE: 09/02/2022  SUBJECTIVE:                                                                                                                                                                                     Pt reports some soreness after last visit, but did not last. No pain at entry. Saw MD yesterday for f/u and suture removal. Per MD note- Active ROM at 4 weeks.  SUBJECTIVE STATEMENT:  PERTINENT HISTORY: None   PAIN:  Are you having pain? Yes: NPRS scale: 4-5/10 8/10  Pain location: left shoulder   Pain description: aching  Aggravating factors: just hurts at times  Relieving factors: ice   PRECAUTIONS: Shoulder follow instability protocol   WEIGHT BEARING RESTRICTIONS: Yes NWB   FALLS:  Has patient fallen in last 6 months? 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  PATIENT GOALS:  To have less pain   NEXT MD VISIT:  2 weeks  OBJECTIVE:   DIAGNOSTIC FINDINGS:  Nothing post op   PATIENT SURVEYS :  FOTO    COGNITION: Overall cognitive status: Within functional limits for tasks assessed     SENSATION: WFL  POSTURE: WNL   UPPER EXTREMITY ROM:   Passive ROM Right eval Left eval  Shoulder flexion  75  Shoulder extension    Shoulder abduction    Shoulder adduction    Shoulder internal rotation  To belly   Shoulder external rotation  6  Elbow flexion  WNL   Elbow extension    Wrist flexion    Wrist extension    Wrist ulnar deviation    Wrist radial deviation    Wrist pronation    Wrist supination    (Blank rows = not tested)  UPPER EXTREMITY MMT:  MMT Right eval Left eval  Shoulder flexion    Shoulder extension    Shoulder abduction    Shoulder adduction    Shoulder internal rotation    Shoulder external rotation    Middle trapezius    Lower trapezius    Elbow flexion    Elbow extension    Wrist flexion    Wrist extension    Wrist ulnar deviation    Wrist radial deviation    Wrist pronation    Wrist supination    Grip strength (lbs)    (Blank rows = not tested) Not tested 2nd to recent surgery    JOINT MOBILITY TESTING:    PALPATION:  No unexpected tenderness to palpation   TODAY'S TREATMENT:                                                                                                                                         DATE:  2/16: -PROM L shoulder -Supine chest press with cane 2x10 -Supine cane flexion 2x10 -Supine cane abduction  2x10 -supine cane ER- 5" hold 2x10 -scap retraction-5" 2x10 -Elbow flex/ext 3# x30L -Ball rolls at table (physioball)- 2x10 flex and abd -Shoulder isometrics- Flex, abd, ER 5" x10 ea  2/9: -PROM L shoulder -Supine chest press with cane x10 -scap retraction-5" 2x10 -Elbow flex/ext 2# 2x10 -Ball rolls at table (physioball)- 2x10 flex and abd -Shoulder isometrics- Flex, abd, ER 5" x10 ea  Eval: Access Code: [email or web address] URL: [email or web address] Date: 09/08/2022 Prepared by: Carolyne Littles  Exercises - Circular Shoulder Pendulum with Table Support  - 3 x daily - 7 x weekly - 3 sets - 10 reps - Seated Scapular Retraction  - 3 x daily - 7 x weekly - 3 sets - 10 reps - Seated Elbow Flexion AAROM  - 3 x daily - 7 x weekly - 3 sets - 10 reps - Tip Pinch with Putty  - 3 x daily - 7 x weekly - 3 sets - 10 reps - Thumb MCP and IP Flexion with Putty  - 3 x daily - 7 x weekly - 3 sets - 10 reps - Key Pinch with Putty  - 3  x daily - 7 x weekly - 3 sets - 10 reps   PATIENT EDUCATION: Education details: HEP; progression of activity  Person educated: Patient Education method: Explanation, Demonstration, Tactile cues, Verbal cues, and Handouts Education comprehension: verbalized understanding, returned demonstration, verbal cues required, tactile cues required, and needs further education  HOME EXERCISE PROGRAM: Access Code: [email or web address] URL: [email or web address] Date: 09/08/2022 Prepared by: Carolyne Littles  Exercises - Circular Shoulder Pendulum with Table Support  - 3 x daily - 7 x weekly - 3 sets - 10 reps - Seated Scapular Retraction  - 3 x daily - 7 x weekly - 3 sets - 10 reps - Seated Elbow Flexion AAROM  - 3 x daily - 7 x weekly - 3 sets - 10 reps - Tip Pinch with Putty  - 3 x daily - 7 x weekly - 3 sets - 10 reps - Thumb MCP and IP Flexion with Putty  - 3 x daily - 7 x weekly - 3 sets - 10 reps - Key Pinch with Putty  - 3 x daily - 7 x weekly - 3 sets - 10  reps  ASSESSMENT:  CLINICAL IMPRESSION: Able to progress gently with AAROM today. No c/o pain with exercises. Pt questioned whether he can climb underneath houses yet. PTA told him this is not allowed right now and not to put weight through LUE. Again reminded pt to adhere to precautions and educated him on protocol and healing process. Demonstrates visual improvements in PROM compared to last visit. Pt will be 4 weeks out on 2/26. Will continue to progress as tolerated.  OBJECTIVE IMPAIRMENTS: decreased knowledge of condition, decreased ROM, decreased strength, impaired UE functional use, and pain.   ACTIVITY LIMITATIONS: carrying, lifting, self feeding, reach over head, and hygiene/grooming  PARTICIPATION LIMITATIONS: meal prep, cleaning, laundry, driving, shopping, community activity, occupation, and school  PERSONAL FACTORS: None .   REHAB POTENTIAL: Excellent  CLINICAL DECISION MAKING: Stable/uncomplicated  EVALUATION COMPLEXITY: Low  GOALS: Goals reviewed with patient? Yes  SHORT TERM GOALS: Target date: 10/18/2022    Patient will increase passive external rotation of the left shoulder to 30 degrees Baseline: Goal status: INITIAL  2.  Patient will increase passive left shoulder flexion to 90 degrees Baseline:  Goal status: INITIAL  3.  Patient will wean out of the sling without significant increase in pain Baseline:  Goal status: INITIAL  4.  Patient will be admitted independent with initial exercise program Baseline:  Goal status: INITIAL LONG TERM GOALS: Target date: 11/29/2022    Patient will begin return to throwing program per MD Baseline:  Goal status: INITIAL  2.  Patient will return to base gym program without pain Baseline:  Goal status: INITIAL  3.  Patient will perform all ADLs and IADLs with dominant arm without increased pain Baseline:  Goal status: INITIAL  PLAN: PT FREQUENCY: 2x/week  PT DURATION: 6 weeks  PLANNED INTERVENTIONS:  Therapeutic exercises, Therapeutic activity, Neuromuscular re-education, Patient/Family education, Self Care, Joint mobilization, Aquatic Therapy, Dry Needling, Electrical stimulation, Cryotherapy, Moist heat, Taping, Ultrasound, and Manual therapy  PLAN FOR NEXT SESSION: begin per protocol    Graceann Congress Sina Sumpter, PTA 09/20/2022, 4:17 PM

## 2022-09-27 ENCOUNTER — Ambulatory Visit (HOSPITAL_BASED_OUTPATIENT_CLINIC_OR_DEPARTMENT_OTHER): Payer: Medicaid Other | Admitting: Physical Therapy

## 2022-09-27 ENCOUNTER — Encounter (HOSPITAL_BASED_OUTPATIENT_CLINIC_OR_DEPARTMENT_OTHER): Payer: Self-pay | Admitting: Physical Therapy

## 2022-09-27 DIAGNOSIS — M6281 Muscle weakness (generalized): Secondary | ICD-10-CM | POA: Diagnosis not present

## 2022-09-27 DIAGNOSIS — M25512 Pain in left shoulder: Secondary | ICD-10-CM | POA: Diagnosis not present

## 2022-09-27 DIAGNOSIS — M25612 Stiffness of left shoulder, not elsewhere classified: Secondary | ICD-10-CM | POA: Diagnosis not present

## 2022-09-27 DIAGNOSIS — S43015A Anterior dislocation of left humerus, initial encounter: Secondary | ICD-10-CM | POA: Diagnosis not present

## 2022-09-27 NOTE — Therapy (Signed)
OUTPATIENT PHYSICAL THERAPY UPPER EXTREMITY EVALUATION   Patient Name: Eric Tapia MRN: [id number] DOB:[birth year], 22 y.o., male Today's Date: 09/27/2022  END OF SESSION:  PT End of Session - 09/27/22 0850     Visit Number 4    Number of Visits 16    Date for PT Re-Evaluation 11/01/22    PT Start Time 0845    PT Stop Time 0927    PT Time Calculation (min) 42 min              Past Medical History:  Diagnosis Date   Attention deficit hyperactivity disorder (ADHD)    Epistaxis    PONV (postoperative nausea and vomiting)    Past Surgical History:  Procedure Laterality Date   NOSE SURGERY     SHOULDER ARTHROSCOPY WITH LABRAL REPAIR Left 09/02/2022   Procedure: LEFT SHOULDER ARTHROSCOPY WITH LABRAL REPAIR;  Surgeon: Vanetta Mulders, MD;  Location: [place];  Service: Orthopedics;  Laterality: Left;   Patient Active Problem List   Diagnosis Date Noted   Anterior dislocation of left shoulder 09/02/2022    PCP: No PCP   REFERRING PROVIDER: Dr Vanetta Mulders   REFERRING DIAG:   [phone] (ICD-10-CM) - Anterior dislocation of left shoulder, initial encounter    THERAPY DIAG:  No diagnosis found.  Rationale for Evaluation and Treatment: Rehabilitation  ONSET DATE: 09/02/2022  SUBJECTIVE:                                                                                                                                                                                     Pt reports some soreness after last visit, but did not last. No pain at entry. Saw MD yesterday for f/u and suture removal. Per MD note- Active ROM at 4 weeks.  SUBJECTIVE STATEMENT:  PERTINENT HISTORY: None   PAIN:  Are you having pain? Yes: NPRS scale: 4-5/10 8/10  Pain location: left shoulder  Pain description: aching  Aggravating factors: just hurts at times  Relieving factors: ice   PRECAUTIONS: Shoulder follow instability protocol   WEIGHT BEARING RESTRICTIONS: Yes NWB   FALLS:  Has patient  fallen in last 6 months? 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  PATIENT GOALS:  To have less pain   NEXT MD VISIT:  2 weeks  OBJECTIVE:   DIAGNOSTIC FINDINGS:  Nothing post op   PATIENT SURVEYS :  FOTO    COGNITION: Overall cognitive status: Within functional limits for tasks assessed     SENSATION: WFL  POSTURE: WNL   UPPER EXTREMITY ROM:   Passive ROM Right  eval Left eval  Shoulder flexion  75  Shoulder extension    Shoulder abduction    Shoulder adduction    Shoulder internal rotation  To belly   Shoulder external rotation  6  Elbow flexion  WNL   Elbow extension    Wrist flexion    Wrist extension    Wrist ulnar deviation    Wrist radial deviation    Wrist pronation    Wrist supination    (Blank rows = not tested)  UPPER EXTREMITY MMT:  MMT Right eval Left eval  Shoulder flexion    Shoulder extension    Shoulder abduction    Shoulder adduction    Shoulder internal rotation    Shoulder external rotation    Middle trapezius    Lower trapezius    Elbow flexion    Elbow extension    Wrist flexion    Wrist extension    Wrist ulnar deviation    Wrist radial deviation    Wrist pronation    Wrist supination    Grip strength (lbs)    (Blank rows = not tested) Not tested 2nd to recent surgery    JOINT MOBILITY TESTING:    PALPATION:  No unexpected tenderness to palpation   TODAY'S TREATMENT:                                                                                                                                         DATE: 2/15 -Supine chest press with cane x20 2lb cane  -Supine cane flexion x20 2lb cane  - Rythmic stabs ER 1 min Flexion 1 min     - SL ER x15 no weight 2x10 with 1lb  - D2 flexion supine 2x20   - row 2x20 red  - shoulder extension 2x20 red    2/16: -PROM L shoulder -Supine chest press with cane 2x10 -Supine cane  flexion 2x10 -Supine cane abduction 2x10 -supine cane ER- 5" hold 2x10 -scap retraction-5" 2x10 -Elbow flex/ext 3# x30L -Ball rolls at table (physioball)- 2x10 flex and abd -Shoulder isometrics- Flex, abd, ER 5" x10 ea  2/9: -PROM L shoulder -Supine chest press with cane x10 -scap retraction-5" 2x10 -Elbow flex/ext 2# 2x10 -Ball rolls at table (physioball)- 2x10 flex and abd -Shoulder isometrics- Flex, abd, ER 5" x10 ea  Eval: Access Code: [email or web address] URL: [email or web address] Date: 09/08/2022 Prepared by: Carolyne Littles  Exercises - Circular Shoulder Pendulum with Table Support  - 3 x daily - 7 x weekly - 3 sets - 10 reps - Seated Scapular Retraction  - 3 x daily - 7 x weekly - 3 sets - 10 reps - Seated Elbow Flexion AAROM  - 3 x daily - 7 x weekly - 3 sets - 10 reps - Tip Pinch with Putty  - 3 x daily - 7 x weekly - 3 sets - 10 reps - Thumb MCP and IP  Flexion with Putty  - 3 x daily - 7 x weekly - 3 sets - 10 reps - Key Pinch with Putty  - 3 x daily - 7 x weekly - 3 sets - 10 reps   PATIENT EDUCATION: Education details: HEP; progression of activity  Person educated: Patient Education method: Explanation, Demonstration, Tactile cues, Verbal cues, and Handouts Education comprehension: verbalized understanding, returned demonstration, verbal cues required, tactile cues required, and needs further education  HOME EXERCISE PROGRAM: Access Code: [email or web address] URL: [email or web address] Date: 09/08/2022 Prepared by: Carolyne Littles  Exercises - Circular Shoulder Pendulum with Table Support  - 3 x daily - 7 x weekly - 3 sets - 10 reps - Seated Scapular Retraction  - 3 x daily - 7 x weekly - 3 sets - 10 reps - Seated Elbow Flexion AAROM  - 3 x daily - 7 x weekly - 3 sets - 10 reps - Tip Pinch with Putty  - 3 x daily - 7 x weekly - 3 sets - 10 reps - Thumb MCP and IP Flexion with Putty  - 3 x daily - 7 x weekly - 3 sets - 10 reps - Key Pinch with Putty  - 3 x  daily - 7 x weekly - 3 sets - 10 reps  ASSESSMENT:  CLINICAL IMPRESSION: Therapy advanced some of the patients exercises today. He was given active D2 flexion and active ER. We loaded ER slightly. He reported fatigue but no pain. We also added resisted scapular exercises. Overall he is making great progress. Therapy will continue to progress as tolerated.   OBJECTIVE IMPAIRMENTS: decreased knowledge of condition, decreased ROM, decreased strength, impaired UE functional use, and pain.   ACTIVITY LIMITATIONS: carrying, lifting, self feeding, reach over head, and hygiene/grooming  PARTICIPATION LIMITATIONS: meal prep, cleaning, laundry, driving, shopping, community activity, occupation, and school  PERSONAL FACTORS: None .   REHAB POTENTIAL: Excellent  CLINICAL DECISION MAKING: Stable/uncomplicated  EVALUATION COMPLEXITY: Low  GOALS: Goals reviewed with patient? Yes  SHORT TERM GOALS: Target date: 10/18/2022    Patient will increase passive external rotation of the left shoulder to 30 degrees Baseline: Goal status: INITIAL  2.  Patient will increase passive left shoulder flexion to 90 degrees Baseline:  Goal status: INITIAL  3.  Patient will wean out of the sling without significant increase in pain Baseline:  Goal status: INITIAL  4.  Patient will be admitted independent with initial exercise program Baseline:  Goal status: INITIAL LONG TERM GOALS: Target date: 11/29/2022    Patient will begin return to throwing program per MD Baseline:  Goal status: INITIAL  2.  Patient will return to base gym program without pain Baseline:  Goal status: INITIAL  3.  Patient will perform all ADLs and IADLs with dominant arm without increased pain Baseline:  Goal status: INITIAL  PLAN: PT FREQUENCY: 2x/week  PT DURATION: 6 weeks  PLANNED INTERVENTIONS: Therapeutic exercises, Therapeutic activity, Neuromuscular re-education, Patient/Family education, Self Care, Joint  mobilization, Aquatic Therapy, Dry Needling, Electrical stimulation, Cryotherapy, Moist heat, Taping, Ultrasound, and Manual therapy  PLAN FOR NEXT SESSION: begin per protocol    Carney Living, PT 09/27/2022, 8:53 AM

## 2022-10-01 ENCOUNTER — Ambulatory Visit (HOSPITAL_BASED_OUTPATIENT_CLINIC_OR_DEPARTMENT_OTHER): Payer: Medicaid Other

## 2022-10-04 ENCOUNTER — Ambulatory Visit (HOSPITAL_BASED_OUTPATIENT_CLINIC_OR_DEPARTMENT_OTHER): Payer: Medicaid Other | Attending: Orthopaedic Surgery

## 2022-10-04 ENCOUNTER — Encounter (HOSPITAL_BASED_OUTPATIENT_CLINIC_OR_DEPARTMENT_OTHER): Payer: Self-pay

## 2022-10-04 DIAGNOSIS — M6281 Muscle weakness (generalized): Secondary | ICD-10-CM | POA: Diagnosis present

## 2022-10-04 DIAGNOSIS — M25612 Stiffness of left shoulder, not elsewhere classified: Secondary | ICD-10-CM | POA: Diagnosis present

## 2022-10-04 DIAGNOSIS — M25512 Pain in left shoulder: Secondary | ICD-10-CM | POA: Insufficient documentation

## 2022-10-04 NOTE — Therapy (Signed)
OUTPATIENT PHYSICAL THERAPY UPPER EXTREMITY EVALUATION   Patient Name: Eric Tapia MRN: [id number] DOB:September 26, 2000, 22 y.o., male Today's Date: 10/04/2022  END OF SESSION:  PT End of Session - 10/04/22 1417     Visit Number 5    Number of Visits 16    Date for PT Re-Evaluation 11/01/22    PT Start Time [id number]    PT Stop Time 1508    PT Time Calculation (min) 35 min    Activity Tolerance Patient tolerated treatment well    Behavior During Therapy WFL for tasks assessed/performed              Past Medical History:  Diagnosis Date   Attention deficit hyperactivity disorder (ADHD)    Epistaxis    PONV (postoperative nausea and vomiting)    Past Surgical History:  Procedure Laterality Date   NOSE SURGERY     SHOULDER ARTHROSCOPY WITH LABRAL REPAIR Left 09/02/2022   Procedure: LEFT SHOULDER ARTHROSCOPY WITH LABRAL REPAIR;  Surgeon: Vanetta Mulders, MD;  Location: [place];  Service: Orthopedics;  Laterality: Left;   Patient Active Problem List   Diagnosis Date Noted   Anterior dislocation of left shoulder 09/02/2022    PCP: No PCP   REFERRING PROVIDER: Dr Vanetta Mulders   REFERRING DIAG:   [phone] (ICD-10-CM) - Anterior dislocation of left shoulder, initial encounter    THERAPY DIAG:  Stiffness of left shoulder, not elsewhere classified  Acute pain of left shoulder  Muscle weakness (generalized)  Rationale for Evaluation and Treatment: Rehabilitation  ONSET DATE: 09/02/2022  SUBJECTIVE:                                                                                                                                                                                     Pt reports 6/10 pain level at entry after rolling over onto it last night.   SUBJECTIVE STATEMENT:  PERTINENT HISTORY: None   PAIN:  Are you having pain? Yes: NPRS scale: 4-5/10 8/10  Pain location: left shoulder  Pain description: aching  Aggravating factors: just hurts at times  Relieving  factors: ice   PRECAUTIONS: Shoulder follow instability protocol   WEIGHT BEARING RESTRICTIONS: Yes NWB   FALLS:  Has patient fallen in last 6 months? 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  PATIENT GOALS:  To have less pain   NEXT MD VISIT:  2 weeks  OBJECTIVE:   DIAGNOSTIC FINDINGS:  Nothing post op   PATIENT SURVEYS :  FOTO    COGNITION: Overall cognitive status: Within functional limits for tasks assessed  SENSATION: WFL  POSTURE: WNL   UPPER EXTREMITY ROM:   Passive ROM Right eval Left eval  Shoulder flexion  75  Shoulder extension    Shoulder abduction    Shoulder adduction    Shoulder internal rotation  To belly   Shoulder external rotation  6  Elbow flexion  WNL   Elbow extension    Wrist flexion    Wrist extension    Wrist ulnar deviation    Wrist radial deviation    Wrist pronation    Wrist supination    (Blank rows = not tested)  UPPER EXTREMITY MMT:  MMT Right eval Left eval  Shoulder flexion    Shoulder extension    Shoulder abduction    Shoulder adduction    Shoulder internal rotation    Shoulder external rotation    Middle trapezius    Lower trapezius    Elbow flexion    Elbow extension    Wrist flexion    Wrist extension    Wrist ulnar deviation    Wrist radial deviation    Wrist pronation    Wrist supination    Grip strength (lbs)    (Blank rows = not tested) Not tested 2nd to recent surgery    JOINT MOBILITY TESTING:    PALPATION:  No unexpected tenderness to palpation   TODAY'S TREATMENT:                                                                                                                                         DATE:  3/1: PROM R shoulder  -supine chest press with cane x20 1lb -Supine cane flexion 2x10 1lb- short arc- within pain limits -Wand ER- 2x10 3" hold -Sidelying ER 2x10- limited range -Prone  row 2x10 -TB row RTB x20 -Pulleys flex and abduction x2' ea  2/15 -Supine chest press with cane x20 2lb cane  -Supine cane flexion x20 2lb cane  - Rythmic stabs ER 1 min Flexion 1 min     - SL ER x15 no weight 2x10 with 1lb  - D2 flexion supine 2x20   - row 2x20 red  - shoulder extension 2x20 red    2/16: -PROM L shoulder -Supine chest press with cane 2x10 -Supine cane flexion 2x10 -Supine cane abduction 2x10 -supine cane ER- 5" hold 2x10 -scap retraction-5" 2x10 -Elbow flex/ext 3# x30L -Ball rolls at table (physioball)- 2x10 flex and abd -Shoulder isometrics- Flex, abd, ER 5" x10 ea   PATIENT EDUCATION: Education details: HEP; progression of activity  Person educated: Patient Education method: Consulting civil engineer, Demonstration, Tactile cues, Verbal cues, and Handouts Education comprehension: verbalized understanding, returned demonstration, verbal cues required, tactile cues required, and needs further education  HOME EXERCISE PROGRAM: Access Code: [email or web address] URL: [email or web address] Date: 09/08/2022 Prepared by: Carolyne Littles  Exercises - Circular Shoulder Pendulum with Table Support  - 3 x daily - 7 x weekly -  3 sets - 10 reps - Seated Scapular Retraction  - 3 x daily - 7 x weekly - 3 sets - 10 reps - Seated Elbow Flexion AAROM  - 3 x daily - 7 x weekly - 3 sets - 10 reps - Tip Pinch with Putty  - 3 x daily - 7 x weekly - 3 sets - 10 reps - Thumb MCP and IP Flexion with Putty  - 3 x daily - 7 x weekly - 3 sets - 10 reps - Key Pinch with Putty  - 3 x daily - 7 x weekly - 3 sets - 10 reps  ASSESSMENT:  CLINICAL IMPRESSION: Pt demonstrates increased guarding today with PROM, limiting available movement. Instructed pt to avoid using LUE in painful ranges until pain level decreases. Able to complete gentle scapulothoracic strengthening without significant complaint. Instructed pt to use ice at home following today's session and again as needed until next  visit.  Will continue to monitor pain level.  OBJECTIVE IMPAIRMENTS: decreased knowledge of condition, decreased ROM, decreased strength, impaired UE functional use, and pain.   ACTIVITY LIMITATIONS: carrying, lifting, self feeding, reach over head, and hygiene/grooming  PARTICIPATION LIMITATIONS: meal prep, cleaning, laundry, driving, shopping, community activity, occupation, and school  PERSONAL FACTORS: None .   REHAB POTENTIAL: Excellent  CLINICAL DECISION MAKING: Stable/uncomplicated  EVALUATION COMPLEXITY: Low  GOALS: Goals reviewed with patient? Yes  SHORT TERM GOALS: Target date: 10/18/2022    Patient will increase passive external rotation of the left shoulder to 30 degrees Baseline: Goal status: INITIAL  2.  Patient will increase passive left shoulder flexion to 90 degrees Baseline:  Goal status: INITIAL  3.  Patient will wean out of the sling without significant increase in pain Baseline:  Goal status: INITIAL  4.  Patient will be admitted independent with initial exercise program Baseline:  Goal status: INITIAL LONG TERM GOALS: Target date: 11/29/2022    Patient will begin return to throwing program per MD Baseline:  Goal status: INITIAL  2.  Patient will return to base gym program without pain Baseline:  Goal status: INITIAL  3.  Patient will perform all ADLs and IADLs with dominant arm without increased pain Baseline:  Goal status: INITIAL  PLAN: PT FREQUENCY: 2x/week  PT DURATION: 6 weeks  PLANNED INTERVENTIONS: Therapeutic exercises, Therapeutic activity, Neuromuscular re-education, Patient/Family education, Self Care, Joint mobilization, Aquatic Therapy, Dry Needling, Electrical stimulation, Cryotherapy, Moist heat, Taping, Ultrasound, and Manual therapy  PLAN FOR NEXT SESSION: begin per protocol    Graceann Congress Shelbylynn Walczyk, PTA 10/04/2022, 3:13 PM

## 2022-10-08 ENCOUNTER — Ambulatory Visit (HOSPITAL_BASED_OUTPATIENT_CLINIC_OR_DEPARTMENT_OTHER): Payer: Medicaid Other | Admitting: Physical Therapy

## 2022-10-08 ENCOUNTER — Encounter (HOSPITAL_BASED_OUTPATIENT_CLINIC_OR_DEPARTMENT_OTHER): Payer: Self-pay | Admitting: Physical Therapy

## 2022-10-08 DIAGNOSIS — M6281 Muscle weakness (generalized): Secondary | ICD-10-CM

## 2022-10-08 DIAGNOSIS — M25612 Stiffness of left shoulder, not elsewhere classified: Secondary | ICD-10-CM | POA: Diagnosis not present

## 2022-10-08 DIAGNOSIS — M25512 Pain in left shoulder: Secondary | ICD-10-CM

## 2022-10-08 NOTE — Therapy (Signed)
OUTPATIENT PHYSICAL THERAPY UPPER EXTREMITY EVALUATION   Patient Name: Eric Tapia MRN: [id number] DOB:[birth year], 22 y.o., male Today's Date: 10/04/2022  END OF SESSION:  PT End of Session - 10/04/22 1417     Visit Number 5    Number of Visits 16    Date for PT Re-Evaluation 11/01/22    PT Start Time [id number]    PT Stop Time 1508    PT Time Calculation (min) 35 min    Activity Tolerance Patient tolerated treatment well    Behavior During Therapy WFL for tasks assessed/performed              Past Medical History:  Diagnosis Date   Attention deficit hyperactivity disorder (ADHD)    Epistaxis    PONV (postoperative nausea and vomiting)    Past Surgical History:  Procedure Laterality Date   NOSE SURGERY     SHOULDER ARTHROSCOPY WITH LABRAL REPAIR Left 09/02/2022   Procedure: LEFT SHOULDER ARTHROSCOPY WITH LABRAL REPAIR;  Surgeon: Vanetta Mulders, MD;  Location: [facility];  Service: Orthopedics;  Laterality: Left;   Patient Active Problem List   Diagnosis Date Noted   Anterior dislocation of left shoulder 09/02/2022    PCP: No PCP   REFERRING PROVIDER: Dr Vanetta Mulders   REFERRING DIAG:   [phone] (ICD-10-CM) - Anterior dislocation of left shoulder, initial encounter    THERAPY DIAG:  Stiffness of left shoulder, not elsewhere classified  Acute pain of left shoulder  Muscle weakness (generalized)  Rationale for Evaluation and Treatment: Rehabilitation  ONSET DATE: 09/02/2022  SUBJECTIVE:                                                                                                                                                                                     No pain at this time. Did well after the last visit.   SUBJECTIVE STATEMENT:  PERTINENT HISTORY: None   PAIN:  Are you having pain? Yes: NPRS scale: 0/10 today.  Pain location: left shoulder  Pain description: aching  Aggravating factors: just hurts at times  Relieving factors: ice    PRECAUTIONS: Shoulder follow instability protocol   WEIGHT BEARING RESTRICTIONS: Yes NWB   FALLS:  Has patient fallen in last 6 months? 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[employer]   PLOF: Independent  PATIENT GOALS:  To have less pain   NEXT MD VISIT:  2 weeks  OBJECTIVE:   DIAGNOSTIC FINDINGS:  Nothing post op   PATIENT SURVEYS :  FOTO    COGNITION: Overall cognitive status: Within functional limits for tasks assessed     SENSATION:  WFL  POSTURE: WNL   UPPER EXTREMITY ROM:   Passive ROM Right eval Left eval  Shoulder flexion  75  Shoulder extension    Shoulder abduction    Shoulder adduction    Shoulder internal rotation  To belly   Shoulder external rotation  6  Elbow flexion  WNL   Elbow extension    Wrist flexion    Wrist extension    Wrist ulnar deviation    Wrist radial deviation    Wrist pronation    Wrist supination    (Blank rows = not tested)  UPPER EXTREMITY MMT:  MMT Right eval Left eval  Shoulder flexion    Shoulder extension    Shoulder abduction    Shoulder adduction    Shoulder internal rotation    Shoulder external rotation    Middle trapezius    Lower trapezius    Elbow flexion    Elbow extension    Wrist flexion    Wrist extension    Wrist ulnar deviation    Wrist radial deviation    Wrist pronation    Wrist supination    Grip strength (lbs)    (Blank rows = not tested) Not tested 2nd to recent surgery    JOINT MOBILITY TESTING:    PALPATION:  No unexpected tenderness to palpation   TODAY'S TREATMENT:                                                                                                                                         DATE: 3/5  Pulley into flexion 2 min   Grade II and III Posterior and inferior glides. Noted improvement in passive ER and IR after mobilization Wand press x20 red  Wand flexion 2x20 red  SIde lying ER 2x15  1lb  D2 flexion x15 no weight x15 1 lb  Supine ABC 2x 2lbs     ABC  2lbs   3/1: PROM R shoulder  -supine chest press with cane x20 1lb -Supine cane flexion 2x10 1lb- short arc- within pain limits -Wand ER- 2x10 3" hold -Sidelying ER 2x10- limited range -Prone row 2x10 -TB row RTB x20 -Pulleys flex and abduction x2' ea  2/15 -Supine chest press with cane x20 2lb cane  -Supine cane flexion x20 2lb cane  - Rythmic stabs ER 1 min Flexion 1 min  Active flexion in mirror x15  Active scaption x15      - SL ER x15 no weight 2x10 with 1lb  - D2 flexion supine 2x20   - row 2x20 red  - shoulder extension 2x20 red    2/16: -PROM L shoulder -Supine chest press with cane 2x10 -Supine cane flexion 2x10 -Supine cane abduction 2x10 -supine cane ER- 5" hold 2x10 -scap retraction-5" 2x10 -Elbow flex/ext 3# x30L -Ball rolls at table (physioball)- 2x10 flex and abd -Shoulder isometrics- Flex, abd, ER 5" x10 ea  PATIENT EDUCATION: Education details: HEP; progression of activity  Person educated: Patient Education method: Explanation, Demonstration, Tactile cues, Verbal cues, and Handouts Education comprehension: verbalized understanding, returned demonstration, verbal cues required, tactile cues required, and needs further education  HOME EXERCISE PROGRAM: Access Code: [email or web address] URL: [email or web address] Date: 09/08/2022 Prepared by: Carolyne Littles  Exercises - Circular Shoulder Pendulum with Table Support  - 3 x daily - 7 x weekly - 3 sets - 10 reps - Seated Scapular Retraction  - 3 x daily - 7 x weekly - 3 sets - 10 reps - Seated Elbow Flexion AAROM  - 3 x daily - 7 x weekly - 3 sets - 10 reps - Tip Pinch with Putty  - 3 x daily - 7 x weekly - 3 sets - 10 reps - Thumb MCP and IP Flexion with Putty  - 3 x daily - 7 x weekly - 3 sets - 10 reps - Key Pinch with Putty  - 3 x daily - 7 x weekly - 3 sets - 10 reps  ASSESSMENT:  CLINICAL IMPRESSION: The  patient had improved ER with manual therapy. He is approaching a normal end range but will likely require more then a normal end range for pitching We iwll continue to work on ER. We advanced some of the weights with his exercises within his tolerance. We will continue to progress as tolerated.  OBJECTIVE IMPAIRMENTS: decreased knowledge of condition, decreased ROM, decreased strength, impaired UE functional use, and pain.   ACTIVITY LIMITATIONS: carrying, lifting, self feeding, reach over head, and hygiene/grooming  PARTICIPATION LIMITATIONS: meal prep, cleaning, laundry, driving, shopping, community activity, occupation, and school  PERSONAL FACTORS: None .   REHAB POTENTIAL: Excellent  CLINICAL DECISION MAKING: Stable/uncomplicated  EVALUATION COMPLEXITY: Low  GOALS: Goals reviewed with patient? Yes  SHORT TERM GOALS: Target date: 10/18/2022    Patient will increase passive external rotation of the left shoulder to 30 degrees Baseline: Goal status: INITIAL  2.  Patient will increase passive left shoulder flexion to 90 degrees Baseline:  Goal status: INITIAL  3.  Patient will wean out of the sling without significant increase in pain Baseline:  Goal status: INITIAL  4.  Patient will be admitted independent with initial exercise program Baseline:  Goal status: INITIAL LONG TERM GOALS: Target date: 11/29/2022    Patient will begin return to throwing program per MD Baseline:  Goal status: INITIAL  2.  Patient will return to base gym program without pain Baseline:  Goal status: INITIAL  3.  Patient will perform all ADLs and IADLs with dominant arm without increased pain Baseline:  Goal status: INITIAL  PLAN: PT FREQUENCY: 2x/week  PT DURATION: 6 weeks  PLANNED INTERVENTIONS: Therapeutic exercises, Therapeutic activity, Neuromuscular re-education, Patient/Family education, Self Care, Joint mobilization, Aquatic Therapy, Dry Needling, Electrical stimulation,  Cryotherapy, Moist heat, Taping, Ultrasound, and Manual therapy  PLAN FOR NEXT SESSION: begin per protocol    Graceann Congress Hodor, PTA 10/04/2022, 3:13 PM

## 2022-10-11 ENCOUNTER — Ambulatory Visit (HOSPITAL_BASED_OUTPATIENT_CLINIC_OR_DEPARTMENT_OTHER): Payer: Medicaid Other | Admitting: Physical Therapy

## 2022-10-11 ENCOUNTER — Encounter (HOSPITAL_BASED_OUTPATIENT_CLINIC_OR_DEPARTMENT_OTHER): Payer: Self-pay | Admitting: Physical Therapy

## 2022-10-11 DIAGNOSIS — M25512 Pain in left shoulder: Secondary | ICD-10-CM

## 2022-10-11 DIAGNOSIS — M25612 Stiffness of left shoulder, not elsewhere classified: Secondary | ICD-10-CM | POA: Diagnosis not present

## 2022-10-11 DIAGNOSIS — M6281 Muscle weakness (generalized): Secondary | ICD-10-CM

## 2022-10-11 NOTE — Therapy (Signed)
OUTPATIENT PHYSICAL THERAPY UPPER EXTREMITY 3/8    Patient Name: Eric Tapia MRN: [id number] DOB:03-18-2001, 22 y.o., male Today's Date: 10/11/2022  END OF SESSION:  PT End of Session - 10/11/22 1443     Visit Number 7    Number of Visits 16    Date for PT Re-Evaluation 11/01/22    Activity Tolerance Patient tolerated treatment well    Behavior During Therapy [facility] for tasks assessed/performed              Past Medical History:  Diagnosis Date   Attention deficit hyperactivity disorder (ADHD)    Epistaxis    PONV (postoperative nausea and vomiting)    Past Surgical History:  Procedure Laterality Date   NOSE SURGERY     SHOULDER ARTHROSCOPY WITH LABRAL REPAIR Left 09/02/2022   Procedure: LEFT SHOULDER ARTHROSCOPY WITH LABRAL REPAIR;  Surgeon: Vanetta Mulders, MD;  Location: [place];  Service: Orthopedics;  Laterality: Left;   Patient Active Problem List   Diagnosis Date Noted   Anterior dislocation of left shoulder 09/02/2022    PCP: No PCP   REFERRING PROVIDER: Dr Vanetta Mulders   REFERRING DIAG:   [phone] (ICD-10-CM) - Anterior dislocation of left shoulder, initial encounter    THERAPY DIAG:  No diagnosis found.  Rationale for Evaluation and Treatment: Rehabilitation  ONSET DATE: 09/02/2022  SUBJECTIVE:                                                                                                                                                                                     No pain at this time. Did well after the last visit.   SUBJECTIVE STATEMENT:  PERTINENT HISTORY: None   PAIN:  Are you having pain? Yes: NPRS scale: 0/10 today.  Pain location: left shoulder  Pain description: aching  Aggravating factors: just hurts at times  Relieving factors: ice   PRECAUTIONS: Shoulder follow instability protocol   WEIGHT BEARING RESTRICTIONS: Yes NWB   FALLS:  Has patient fallen in last 6 months? 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  PATIENT GOALS:  To have less pain   NEXT MD VISIT:  2 weeks  OBJECTIVE:   DIAGNOSTIC FINDINGS:  Nothing post op   PATIENT SURVEYS :  FOTO    COGNITION: Overall cognitive status: Within functional limits for tasks assessed     SENSATION: WFL  POSTURE: WNL   UPPER EXTREMITY ROM:   Passive ROM Right eval Left eval  Shoulder flexion  75  Shoulder extension    Shoulder abduction    Shoulder adduction  Shoulder internal rotation  To belly   Shoulder external rotation  6  Elbow flexion  WNL   Elbow extension    Wrist flexion    Wrist extension    Wrist ulnar deviation    Wrist radial deviation    Wrist pronation    Wrist supination    (Blank rows = not tested)  UPPER EXTREMITY MMT:  MMT Right eval Left eval  Shoulder flexion    Shoulder extension    Shoulder abduction    Shoulder adduction    Shoulder internal rotation    Shoulder external rotation    Middle trapezius    Lower trapezius    Elbow flexion    Elbow extension    Wrist flexion    Wrist extension    Wrist ulnar deviation    Wrist radial deviation    Wrist pronation    Wrist supination    Grip strength (lbs)    (Blank rows = not tested) Not tested 2nd to recent surgery    JOINT MOBILITY TESTING:    PALPATION:  No unexpected tenderness to palpation   TODAY'S TREATMENT:                                                                                                                                         DATE: 3/8 Pulley into flexion 2 min   Grade II and III Posterior and inferior glides.  Wand press x20 red  Wand flexion 2x20 red  SIde lying ER 2x15 1lb  D2 flexion x15 no weight x15 1 lb  Supine ABC 2x 3lbs   3/5  Pulley into flexion 2 min   Grade II and III Posterior and inferior glides. Noted improvement in passive ER and IR after mobilization Wand  press x20 red  Wand flexion 2x20 red  SIde lying ER 2x15 1lb  D2 flexion x15 no weight x15 1 lb  Supine ABC 2x 2lbs     ABC  2lbs   3/1: PROM R shoulder  -supine chest press with cane x20 1lb -Supine cane flexion 2x10 1lb- short arc- within pain limits -Wand ER- 2x10 3" hold -Sidelying ER 2x10- limited range -Prone row 2x10 -TB row RTB x20 -Pulleys flex and abduction x2' ea   PATIENT EDUCATION: Education details: HEP; progression of activity  Person educated: Patient Education method: Explanation, Demonstration, Tactile cues, Verbal cues, and Handouts Education comprehension: verbalized understanding, returned demonstration, verbal cues required, tactile cues required, and needs further education  HOME EXERCISE PROGRAM: Access Code: [email or web address] URL: [email or web address] Date: 09/08/2022 Prepared by: Carolyne Littles  Exercises - Circular Shoulder Pendulum with Table Support  - 3 x daily - 7 x weekly - 3 sets - 10 reps - Seated Scapular Retraction  - 3 x daily - 7 x weekly - 3 sets - 10 reps - Seated Elbow Flexion AAROM  - 3 x daily - 7 x  weekly - 3 sets - 10 reps - Tip Pinch with Putty  - 3 x daily - 7 x weekly - 3 sets - 10 reps - Thumb MCP and IP Flexion with Putty  - 3 x daily - 7 x weekly - 3 sets - 10 reps - Key Pinch with Putty  - 3 x daily - 7 x weekly - 3 sets - 10 reps  ASSESSMENT:  CLINICAL IMPRESSION: The patient had full ROM today after manual therapy. We continue to slowly progress his strengthening exercises. He had more fatigue with his exercises today which is good. We will continue to progress strengthening exercises as tolerated. We kept his SL ER consistent. We added banded IR with a light band.  OBJECTIVE IMPAIRMENTS: decreased knowledge of condition, decreased ROM, decreased strength, impaired UE functional use, and pain.   ACTIVITY LIMITATIONS: carrying, lifting, self feeding, reach over head, and hygiene/grooming  PARTICIPATION  LIMITATIONS: meal prep, cleaning, laundry, driving, shopping, community activity, occupation, and school  PERSONAL FACTORS: None .   REHAB POTENTIAL: Excellent  CLINICAL DECISION MAKING: Stable/uncomplicated  EVALUATION COMPLEXITY: Low  GOALS: Goals reviewed with patient? Yes  SHORT TERM GOALS: Target date: 10/18/2022    Patient will increase passive external rotation of the left shoulder to 30 degrees Baseline: Goal status: INITIAL  2.  Patient will increase passive left shoulder flexion to 90 degrees Baseline:  Goal status: INITIAL  3.  Patient will wean out of the sling without significant increase in pain Baseline:  Goal status: INITIAL  4.  Patient will be admitted independent with initial exercise program Baseline:  Goal status: INITIAL LONG TERM GOALS: Target date: 11/29/2022    Patient will begin return to throwing program per MD Baseline:  Goal status: INITIAL  2.  Patient will return to base gym program without pain Baseline:  Goal status: INITIAL  3.  Patient will perform all ADLs and IADLs with dominant arm without increased pain Baseline:  Goal status: INITIAL  PLAN: PT FREQUENCY: 2x/week  PT DURATION: 6 weeks  PLANNED INTERVENTIONS: Therapeutic exercises, Therapeutic activity, Neuromuscular re-education, Patient/Family education, Self Care, Joint mobilization, Aquatic Therapy, Dry Needling, Electrical stimulation, Cryotherapy, Moist heat, Taping, Ultrasound, and Manual therapy  PLAN FOR NEXT SESSION: begin per protocol    Carney Living, PT 10/11/2022, 2:44 PM

## 2022-10-15 ENCOUNTER — Ambulatory Visit (HOSPITAL_BASED_OUTPATIENT_CLINIC_OR_DEPARTMENT_OTHER): Payer: Medicaid Other

## 2022-10-15 ENCOUNTER — Encounter (HOSPITAL_BASED_OUTPATIENT_CLINIC_OR_DEPARTMENT_OTHER): Payer: Self-pay

## 2022-10-15 DIAGNOSIS — M6281 Muscle weakness (generalized): Secondary | ICD-10-CM

## 2022-10-15 DIAGNOSIS — M25512 Pain in left shoulder: Secondary | ICD-10-CM

## 2022-10-15 DIAGNOSIS — M25612 Stiffness of left shoulder, not elsewhere classified: Secondary | ICD-10-CM

## 2022-10-15 NOTE — Therapy (Signed)
OUTPATIENT PHYSICAL THERAPY UPPER EXTREMITY 3/8    Patient Name: Eric Tapia MRN: [id number] DOB:2000-12-23, 22 y.o., male Today's Date: 10/15/2022  END OF SESSION:  PT End of Session - 10/15/22 1438     Visit Number 8    Number of Visits 16    Date for PT Re-Evaluation 11/01/22    PT Start Time [id number]    PT Stop Time 1513    PT Time Calculation (min) 36 min    Activity Tolerance Patient tolerated treatment well    Behavior During Therapy WFL for tasks assessed/performed              Past Medical History:  Diagnosis Date   Attention deficit hyperactivity disorder (ADHD)    Epistaxis    PONV (postoperative nausea and vomiting)    Past Surgical History:  Procedure Laterality Date   NOSE SURGERY     SHOULDER ARTHROSCOPY WITH LABRAL REPAIR Left 09/02/2022   Procedure: LEFT SHOULDER ARTHROSCOPY WITH LABRAL REPAIR;  Surgeon: Vanetta Mulders, MD;  Location: [place];  Service: Orthopedics;  Laterality: Left;   Patient Active Problem List   Diagnosis Date Noted   Anterior dislocation of left shoulder 09/02/2022    PCP: No PCP   REFERRING PROVIDER: Dr Vanetta Mulders   REFERRING DIAG:   [phone] (ICD-10-CM) - Anterior dislocation of left shoulder, initial encounter    THERAPY DIAG:  Stiffness of left shoulder, not elsewhere classified  Acute pain of left shoulder  Muscle weakness (generalized)  Rationale for Evaluation and Treatment: Rehabilitation  ONSET DATE: 09/02/2022  SUBJECTIVE:                                                                                                                                                                                     No soreness after last visit.   SUBJECTIVE STATEMENT:  PERTINENT HISTORY: None   PAIN:  Are you having pain? Yes: NPRS scale: 0/10 today.  Pain location: left shoulder  Pain description: aching  Aggravating factors: just hurts at times  Relieving factors: ice   PRECAUTIONS: Shoulder follow  instability protocol   WEIGHT BEARING RESTRICTIONS: Yes NWB   FALLS:  Has patient fallen in last 6 months? 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  PATIENT GOALS:  To have less pain   NEXT MD VISIT:  2 weeks  OBJECTIVE:   DIAGNOSTIC FINDINGS:  Nothing post op   PATIENT SURVEYS :  FOTO    COGNITION: Overall cognitive status: Within functional limits for tasks assessed     SENSATION: WFL  POSTURE: WNL  UPPER EXTREMITY ROM:   Passive ROM Right eval Left eval  Shoulder flexion  75  Shoulder extension    Shoulder abduction    Shoulder adduction    Shoulder internal rotation  To belly   Shoulder external rotation  6  Elbow flexion  WNL   Elbow extension    Wrist flexion    Wrist extension    Wrist ulnar deviation    Wrist radial deviation    Wrist pronation    Wrist supination    (Blank rows = not tested)  UPPER EXTREMITY MMT:  MMT Right eval Left eval  Shoulder flexion    Shoulder extension    Shoulder abduction    Shoulder adduction    Shoulder internal rotation    Shoulder external rotation    Middle trapezius    Lower trapezius    Elbow flexion    Elbow extension    Wrist flexion    Wrist extension    Wrist ulnar deviation    Wrist radial deviation    Wrist pronation    Wrist supination    Grip strength (lbs)    (Blank rows = not tested) Not tested 2nd to recent surgery    JOINT MOBILITY TESTING:    PALPATION:  No unexpected tenderness to palpation   TODAY'S TREATMENT:                                                                                                                                         DATE:  3/12 Pulley into flexion and abduction 2 min ea  Grade II and III Posterior and inferior glides.  PROM all planes Wand press x20 red  Wand flexion 2x20 red  SIde lying ER 2x15 1lb   Supine ABC 2x 3lbs  Prone I,Y,T 1#  2x10ea Rhythmic stabilization x30sec ea (weaker with side to side)  IR behind back with strap- 10sec x5   3/8 Pulley into flexion 2 min   Grade II and III Posterior and inferior glides.  Wand press x20 red  Wand flexion 2x20 red  SIde lying ER 2x15 1lb  D2 flexion x15 no weight x15 1 lb  Supine ABC 2x 3lbs   3/5  Pulley into flexion 2 min   Grade II and III Posterior and inferior glides. Noted improvement in passive ER and IR after mobilization Wand press x20 red  Wand flexion 2x20 red  SIde lying ER 2x15 1lb  D2 flexion x15 no weight x15 1 lb  Supine ABC 2x 2lbs     ABC  2lbs   3/1: PROM R shoulder  -supine chest press with cane x20 1lb -Supine cane flexion 2x10 1lb- short arc- within pain limits -Wand ER- 2x10 3" hold -Sidelying ER 2x10- limited range -Prone row 2x10 -TB row RTB x20 -Pulleys flex and abduction x2' ea   PATIENT EDUCATION: Education details: HEP; progression of activity  Person educated: Patient Education method: Explanation, Demonstration, Tactile cues, Verbal cues, and Handouts Education comprehension: verbalized understanding, returned demonstration, verbal cues required, tactile cues required, and needs further education  HOME EXERCISE PROGRAM: Access Code: [email or web address] URL: [email or web address] Date: 09/08/2022 Prepared by: Carolyne Littles  Exercises - Circular Shoulder Pendulum with Table Support  - 3 x daily - 7 x weekly - 3 sets - 10 reps - Seated Scapular Retraction  - 3 x daily - 7 x weekly - 3 sets - 10 reps - Seated Elbow Flexion AAROM  - 3 x daily - 7 x weekly - 3 sets - 10 reps - Tip Pinch with Putty  - 3 x daily - 7 x weekly - 3 sets - 10 reps - Thumb MCP and IP Flexion with Putty  - 3 x daily - 7 x weekly - 3 sets - 10 reps - Key Pinch with Putty  - 3 x daily - 7 x weekly - 3 sets - 10 reps  ASSESSMENT:  CLINICAL IMPRESSION: Pt able to progress with periscapular strengthening today with good tolerance. He  demonstrates mild fatigue and shakiness prone exercises and also had weakness with rhythmic stabilization with manual resistance side to side. He is stiff into very end range flexion/abduction. Most limited with passive IR. Able to initiate IR stretch behind back. He demonstrates good motion here, but severe scapular winging present. Added SA punches to initiate SA strengthening. Will continue ot progress as tolerated.  OBJECTIVE IMPAIRMENTS: decreased knowledge of condition, decreased ROM, decreased strength, impaired UE functional use, and pain.   ACTIVITY LIMITATIONS: carrying, lifting, self feeding, reach over head, and hygiene/grooming  PARTICIPATION LIMITATIONS: meal prep, cleaning, laundry, driving, shopping, community activity, occupation, and school  PERSONAL FACTORS: None .   REHAB POTENTIAL: Excellent  CLINICAL DECISION MAKING: Stable/uncomplicated  EVALUATION COMPLEXITY: Low  GOALS: Goals reviewed with patient? Yes  SHORT TERM GOALS: Target date: 10/18/2022    Patient will increase passive external rotation of the left shoulder to 30 degrees Baseline: Goal status: INITIAL  2.  Patient will increase passive left shoulder flexion to 90 degrees Baseline:  Goal status: INITIAL  3.  Patient will wean out of the sling without significant increase in pain Baseline:  Goal status: IN PROGRESS  4.  Patient will be admitted independent with initial exercise program Baseline:  Goal status: INITIAL LONG TERM GOALS: Target date: 11/29/2022    Patient will begin return to throwing program per MD Baseline:  Goal status: INITIAL  2.  Patient will return to base gym program without pain Baseline:  Goal status: INITIAL  3.  Patient will perform all ADLs and IADLs with dominant arm without increased pain Baseline:  Goal status: INITIAL  PLAN: PT FREQUENCY: 2x/week  PT DURATION: 6 weeks  PLANNED INTERVENTIONS: Therapeutic exercises, Therapeutic activity, Neuromuscular  re-education, Patient/Family education, Self Care, Joint mobilization, Aquatic Therapy, Dry Needling, Electrical stimulation, Cryotherapy, Moist heat, Taping, Ultrasound, and Manual therapy  PLAN FOR NEXT SESSION: begin per protocol    Graceann Congress Deniqua Perry, PTA 10/15/2022, 3:46 PM

## 2022-10-17 ENCOUNTER — Encounter (HOSPITAL_BASED_OUTPATIENT_CLINIC_OR_DEPARTMENT_OTHER): Payer: Medicaid Other | Admitting: Orthopaedic Surgery

## 2022-10-18 ENCOUNTER — Ambulatory Visit (HOSPITAL_BASED_OUTPATIENT_CLINIC_OR_DEPARTMENT_OTHER): Payer: Medicaid Other

## 2022-10-22 ENCOUNTER — Ambulatory Visit (HOSPITAL_BASED_OUTPATIENT_CLINIC_OR_DEPARTMENT_OTHER): Payer: Medicaid Other

## 2022-10-22 DIAGNOSIS — M6281 Muscle weakness (generalized): Secondary | ICD-10-CM

## 2022-10-22 DIAGNOSIS — M25512 Pain in left shoulder: Secondary | ICD-10-CM

## 2022-10-22 DIAGNOSIS — M25612 Stiffness of left shoulder, not elsewhere classified: Secondary | ICD-10-CM | POA: Diagnosis not present

## 2022-10-22 NOTE — Therapy (Signed)
OUTPATIENT PHYSICAL THERAPY UPPER EXTREMITY PROGRESS NOTE    Patient Name: Eric Tapia MRN: [id number] DOB:[birth year], 22 y.o., male Today's Date: 10/23/2022  END OF SESSION:  PT End of Session - 10/23/22 0838     Visit Number 9    Number of Visits 16    Date for PT Re-Evaluation 11/01/22    PT Start Time 1430    PT Stop Time 1513    PT Time Calculation (min) 43 min    Activity Tolerance Patient tolerated treatment well    Behavior During Therapy WFL for tasks assessed/performed               Past Medical History:  Diagnosis Date   Attention deficit hyperactivity disorder (ADHD)    Epistaxis    PONV (postoperative nausea and vomiting)    Past Surgical History:  Procedure Laterality Date   NOSE SURGERY     SHOULDER ARTHROSCOPY WITH LABRAL REPAIR Left 09/02/2022   Procedure: LEFT SHOULDER ARTHROSCOPY WITH LABRAL REPAIR;  Surgeon: Vanetta Mulders, MD;  Location: [place];  Service: Orthopedics;  Laterality: Left;   Patient Active Problem List   Diagnosis Date Noted   Anterior dislocation of left shoulder 09/02/2022    PCP: No PCP   REFERRING PROVIDER: Dr Vanetta Mulders   REFERRING DIAG:   [phone] (ICD-10-CM) - Anterior dislocation of left shoulder, initial encounter    THERAPY DIAG:  Stiffness of left shoulder, not elsewhere classified  Acute pain of left shoulder  Muscle weakness (generalized)  Rationale for Evaluation and Treatment: Rehabilitation  ONSET DATE: 09/02/2022  SUBJECTIVE:                                                                                                                                                                                     Pt reports no pain or soreness in L shoulder at entry.    SUBJECTIVE STATEMENT:  PERTINENT HISTORY: None   PAIN:  Are you having pain? Yes: NPRS scale: 0/10 today.  Pain location: left shoulder  Pain description: aching  Aggravating factors: just hurts at times  Relieving factors: ice    PRECAUTIONS: Shoulder follow instability protocol   WEIGHT BEARING RESTRICTIONS: Yes NWB   FALLS:  Has patient fallen in last 6 months? 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  PATIENT GOALS:  To have less pain   NEXT MD VISIT:  2 weeks  OBJECTIVE:     DIAGNOSTIC FINDINGS:  Nothing post op   PATIENT SURVEYS :  FOTO    COGNITION: Overall cognitive status: Within functional limits for tasks  assessed     SENSATION: WFL  POSTURE: WNL  UPPER EXTREMITY ROM:   Passive ROM Right eval Left eval Left 3/19  Shoulder flexion  75 142  Shoulder extension     Shoulder abduction   155  Shoulder adduction     Shoulder internal rotation  To belly  61  Shoulder external rotation  6 73  Elbow flexion  WNL    Elbow extension     Wrist flexion     Wrist extension     Wrist ulnar deviation     Wrist radial deviation     Wrist pronation     Wrist supination     (Blank rows = not tested)  Active ROM Right eval Left eval Left 3/19  Shoulder flexion   145  Shoulder extension   140 (compensates)  Shoulder abduction   155  Shoulder adduction     Shoulder internal rotation   T6  Shoulder external rotation   T3  Elbow flexion     Elbow extension     Wrist flexion     Wrist extension     Wrist ulnar deviation     Wrist radial deviation     Wrist pronation     Wrist supination     (Blank rows = not tested)  UPPER EXTREMITY MMT:  MMT Right eval Left eval  Shoulder flexion    Shoulder extension    Shoulder abduction    Shoulder adduction    Shoulder internal rotation    Shoulder external rotation    Middle trapezius    Lower trapezius    Elbow flexion    Elbow extension    Wrist flexion    Wrist extension    Wrist ulnar deviation    Wrist radial deviation    Wrist pronation    Wrist supination    Grip strength (lbs)    (Blank rows = not tested) Not tested 2nd to  recent surgery    JOINT MOBILITY TESTING:    PALPATION:  No unexpected tenderness to palpation   TODAY'S TREATMENT:                                                                                                                                         DATE:  3/19 Updated measurements:  Pulley into flexion and abduction 2 min ea  Grade II and III Posterior and inferior glides.  PROM all planes SIde lying ER 2x10 1lb   Supine ABC 1x 3lbs  Supine active flexion to 100 2x10 1lb Prone I,Y,T 1# 2x10ea Rhythmic stabilization x30sec ea (weaker with side to side)  IR behind back with strap- 10sec x5 Rows- Blue TB 3x10   3/12 Pulley into flexion and abduction 2 min ea  Grade II and III Posterior and inferior glides.  PROM all planes Wand press x20 red  Wand flexion 2x20 red  SIde  lying ER 2x15 1lb   Supine ABC 2x 3lbs  Prone I,Y,T 1# 2x10ea Rhythmic stabilization x30sec ea (weaker with side to side)  IR behind back with strap- 10sec x5  PATIENT EDUCATION: Education details: HEP; progression of activity  Person educated: Patient Education method: Explanation, Demonstration, Tactile cues, Verbal cues, and Handouts Education comprehension: verbalized understanding, returned demonstration, verbal cues required, tactile cues required, and needs further education  HOME EXERCISE PROGRAM: Access Code: [email or web address] URL: [email or web address] Date: 09/08/2022 Prepared by: Carolyne Littles   CLINICAL IMPRESSION: Pt is now 7 weeks post surgery and is progressing steadily.  Has met all STGs and no LTG at this time. He demonstrates significant increases in ROM. He does remain restricted into end range flexion, abd, and IR especially. This did improve with stretching; however, firm end feel is still present. Steady progress with protocol thus far. Plan to focus more on standing active strengthening as pt nears 8 weeks s/p. Scapular winging prest with AROM and responds well  to tactile cuing/manual stabilization to correct. He will benefit from continued PT to progress shoulder strengthening and eventually return to baseball.   OBJECTIVE IMPAIRMENTS: decreased knowledge of condition, decreased ROM, decreased strength, impaired UE functional use, and pain.   ACTIVITY LIMITATIONS: carrying, lifting, self feeding, reach over head, and hygiene/grooming  PARTICIPATION LIMITATIONS: meal prep, cleaning, laundry, driving, shopping, community activity, occupation, and school  PERSONAL FACTORS: None .   REHAB POTENTIAL: Excellent  CLINICAL DECISION MAKING: Stable/uncomplicated  EVALUATION COMPLEXITY: Low  GOALS: Goals reviewed with patient? Yes  SHORT TERM GOALS: Target date: 10/18/2022    Patient will increase passive external rotation of the left shoulder to 30 degrees Baseline: Goal status: MET 10/22/22  2.  Patient will increase passive left shoulder flexion to 90 degrees Baseline:  Goal status: MET 10/22/22  3.  Patient will wean out of the sling without significant increase in pain Baseline:  Goal status: MET 10/22/22  4.  Patient will be admitted independent with initial exercise program Baseline:  Goal status: MET 10/22/22 LONG TERM GOALS: Target date: 11/29/2022    Patient will begin return to throwing program per MD Baseline:  Goal status: INITIAL  2.  Patient will return to base gym program without pain Baseline:  Goal status: INITIAL  3.  Patient will perform all ADLs and IADLs with dominant arm without increased pain Baseline:  Goal status: INITIAL  PLAN: PT FREQUENCY: 2x/week  PT DURATION: 6 weeks  PLANNED INTERVENTIONS: Therapeutic exercises, Therapeutic activity, Neuromuscular re-education, Patient/Family education, Self Care, Joint mobilization, Aquatic Therapy, Dry Needling, Electrical stimulation, Cryotherapy, Moist heat, Taping, Ultrasound, and Manual therapy  PLAN FOR NEXT SESSION: begin per protocol    Graceann Congress  Zelma Snead, PTA 10/23/2022, 8:43 AM

## 2022-10-23 ENCOUNTER — Encounter (HOSPITAL_BASED_OUTPATIENT_CLINIC_OR_DEPARTMENT_OTHER): Payer: Self-pay

## 2022-10-25 ENCOUNTER — Ambulatory Visit (HOSPITAL_BASED_OUTPATIENT_CLINIC_OR_DEPARTMENT_OTHER): Payer: Medicaid Other | Admitting: Physical Therapy

## 2022-10-29 ENCOUNTER — Encounter (HOSPITAL_BASED_OUTPATIENT_CLINIC_OR_DEPARTMENT_OTHER): Payer: Self-pay | Admitting: Physical Therapy

## 2022-10-29 ENCOUNTER — Ambulatory Visit (HOSPITAL_BASED_OUTPATIENT_CLINIC_OR_DEPARTMENT_OTHER): Payer: Medicaid Other | Admitting: Physical Therapy

## 2022-10-29 DIAGNOSIS — M25612 Stiffness of left shoulder, not elsewhere classified: Secondary | ICD-10-CM

## 2022-10-29 DIAGNOSIS — M6281 Muscle weakness (generalized): Secondary | ICD-10-CM

## 2022-10-29 DIAGNOSIS — M25512 Pain in left shoulder: Secondary | ICD-10-CM

## 2022-10-29 NOTE — Therapy (Signed)
OUTPATIENT PHYSICAL THERAPY UPPER EXTREMITY PROGRESS NOTE    Patient Name: Eric Tapia MRN: [id number] DOB:May 10, 2001, 22 y.o., male Today's Date: 10/23/2022  END OF SESSION:  PT End of Session - 10/23/22 0838     Visit Number 9    Number of Visits 16    Date for PT Re-Evaluation 11/01/22    PT Start Time 1430    PT Stop Time 1513    PT Time Calculation (min) 43 min    Activity Tolerance Patient tolerated treatment well    Behavior During Therapy WFL for tasks assessed/performed               Past Medical History:  Diagnosis Date   Attention deficit hyperactivity disorder (ADHD)    Epistaxis    PONV (postoperative nausea and vomiting)    Past Surgical History:  Procedure Laterality Date   NOSE SURGERY     SHOULDER ARTHROSCOPY WITH LABRAL REPAIR Left 09/02/2022   Procedure: LEFT SHOULDER ARTHROSCOPY WITH LABRAL REPAIR;  Surgeon: Vanetta Mulders, MD;  Location: [place];  Service: Orthopedics;  Laterality: Left;   Patient Active Problem List   Diagnosis Date Noted   Anterior dislocation of left shoulder 09/02/2022    PCP: No PCP   REFERRING PROVIDER: Dr Vanetta Mulders   REFERRING DIAG:   [phone] (ICD-10-CM) - Anterior dislocation of left shoulder, initial encounter    THERAPY DIAG:  Stiffness of left shoulder, not elsewhere classified  Acute pain of left shoulder  Muscle weakness (generalized)  Rationale for Evaluation and Treatment: Rehabilitation  ONSET DATE: 09/02/2022   Days since surgery: 57  SUBJECTIVE:                                                                                                                                                                                     Pt reports no pain or soreness in L shoulder at entry.    SUBJECTIVE STATEMENT:  PERTINENT HISTORY: None   PAIN:  Are you having pain? Yes: NPRS scale: 0/10 today.  Pain location: left shoulder  Pain description: aching  Aggravating factors: just hurts at  times  Relieving factors: ice   PRECAUTIONS: Shoulder follow instability protocol   WEIGHT BEARING RESTRICTIONS: Yes NWB   FALLS:  Has patient fallen in last 6 months? 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  PATIENT GOALS:  To have less pain   NEXT MD VISIT:  2 weeks  OBJECTIVE:     DIAGNOSTIC FINDINGS:  Nothing post op   PATIENT SURVEYS :  FOTO    COGNITION: Overall cognitive  status: Within functional limits for tasks assessed     SENSATION: WFL  POSTURE: WNL  UPPER EXTREMITY ROM:   Passive ROM Right eval Left eval Left 3/19  Shoulder flexion  75 142  Shoulder extension     Shoulder abduction   155  Shoulder adduction     Shoulder internal rotation  To belly  61  Shoulder external rotation  6 73  Elbow flexion  WNL    Elbow extension     Wrist flexion     Wrist extension     Wrist ulnar deviation     Wrist radial deviation     Wrist pronation     Wrist supination     (Blank rows = not tested)  Active ROM Right eval Left eval Left 3/19  Shoulder flexion   145  Shoulder extension   140 (compensates)  Shoulder abduction   155  Shoulder adduction     Shoulder internal rotation   T6  Shoulder external rotation   T3  Elbow flexion     Elbow extension     Wrist flexion     Wrist extension     Wrist ulnar deviation     Wrist radial deviation     Wrist pronation     Wrist supination     (Blank rows = not tested)  UPPER EXTREMITY MMT:  MMT Right eval Left eval  Shoulder flexion 26 21  Shoulder extension    Shoulder abduction 24 20.4  Shoulder adduction    Shoulder internal rotation 24.2 20.6  Shoulder external rotation 20.5 17.7  Middle trapezius    Lower trapezius    Elbow flexion    Elbow extension    Wrist flexion    Wrist extension    Wrist ulnar deviation    Wrist radial deviation    Wrist pronation    Wrist supination    Grip  strength (lbs)    (Blank rows = not tested) Not tested 2nd to recent surgery    JOINT MOBILITY TESTING:    PALPATION:  No unexpected tenderness to palpation   TODAY'S TREATMENT:                                                                                                                                         DATE: 3/26 Reviewed strength measurements   Pulley into flexion and abduction 2 min ea Supine active flexion to 100 2x10 3lb SIde lying ER 2x10 3lb Supine ABC 1x 3lbs  Rhythmic stabilization x30sec ea in standing  Quadrecep rocks x20  Quadriceps alternating arm flexion 2x10  Rows red band 2x10 Shoulder extension 2x10 Prone shoulder row 2x10 Prone extension 2x10  3/19 Updated measurements:  Pulley into flexion and abduction 2 min ea  Grade II and III Posterior and inferior glides.  PROM all planes SIde lying ER 2x10 1lb   Supine ABC 1x 3lbs  Supine active flexion to 100 2x10 1lb Prone I,Y,T 1# 2x10ea Rhythmic stabilization x30sec ea (weaker with side to side)  IR behind back with strap- 10sec x5 Rows- Blue TB 3x10   3/12 Pulley into flexion and abduction 2 min ea  Grade II and III Posterior and inferior glides.  PROM all planes Wand press x20 red  Wand flexion 2x20 red  SIde lying ER 2x15 1lb   Supine ABC 2x 3lbs  Prone I,Y,T 1# 2x10ea Rhythmic stabilization x30sec ea (weaker with side to side)  IR behind back with strap- 10sec x5  PATIENT EDUCATION: Education details: HEP; progression of activity  Person educated: Patient Education method: Explanation, Demonstration, Tactile cues, Verbal cues, and Handouts Education comprehension: verbalized understanding, returned demonstration, verbal cues required, tactile cues required, and needs further education  HOME EXERCISE PROGRAM: Access Code: [email or web address] URL: [email or web address] Date: 09/08/2022 Prepared by: Carolyne Littles   CLINICAL IMPRESSION: Patient continues to progress  well. His strength measurements are near 80% of his unaffected shoulder. He is now 8 weeks post op. We added in weight bearing exercises today. Patient was advanced to Rhythmic stabilization in standing. Patient was able to progress weight in wand shoulder flexion. We will also advance sport specific exercises.   OBJECTIVE IMPAIRMENTS: decreased knowledge of condition, decreased ROM, decreased strength, impaired UE functional use, and pain.   ACTIVITY LIMITATIONS: carrying, lifting, self feeding, reach over head, and hygiene/grooming  PARTICIPATION LIMITATIONS: meal prep, cleaning, laundry, driving, shopping, community activity, occupation, and school  PERSONAL FACTORS: None .   REHAB POTENTIAL: Excellent  CLINICAL DECISION MAKING: Stable/uncomplicated  EVALUATION COMPLEXITY: Low  GOALS: Goals reviewed with patient? Yes  SHORT TERM GOALS: Target date: 10/18/2022    Patient will increase passive external rotation of the left shoulder to 30 degrees Baseline: Goal status: MET 10/22/22  2.  Patient will increase passive left shoulder flexion to 90 degrees Baseline:  Goal status: MET 10/22/22  3.  Patient will wean out of the sling without significant increase in pain Baseline:  Goal status: MET 10/22/22  4.  Patient will be admitted independent with initial exercise program Baseline:  Goal status: MET 10/22/22 LONG TERM GOALS: Target date: 11/29/2022    Patient will begin return to throwing program per MD Baseline:  Goal status: INITIAL  2.  Patient will return to base gym program without pain Baseline:  Goal status: INITIAL  3.  Patient will perform all ADLs and IADLs with dominant arm without increased pain Baseline:  Goal status: INITIAL  PLAN:  PT FREQUENCY: 2x/week  PT DURATION: 6 weeks  PLANNED INTERVENTIONS: Therapeutic exercises, Therapeutic activity, Neuromuscular re-education, Patient/Family education, Self Care, Joint mobilization, Aquatic Therapy, Dry  Needling, Electrical stimulation, Cryotherapy, Moist heat, Taping, Ultrasound, and Manual therapy  PLAN FOR NEXT SESSION: Continue to work on strengthening and ROM. We will progress as tolerated and as protocol.     Graceann Congress [place]or, PTA 10/23/2022, 8:43 AM

## 2022-10-30 ENCOUNTER — Encounter (HOSPITAL_BASED_OUTPATIENT_CLINIC_OR_DEPARTMENT_OTHER): Payer: Self-pay | Admitting: Physical Therapy

## 2022-10-31 ENCOUNTER — Ambulatory Visit (HOSPITAL_BASED_OUTPATIENT_CLINIC_OR_DEPARTMENT_OTHER): Payer: Medicaid Other | Admitting: Physical Therapy

## 2022-10-31 DIAGNOSIS — M25612 Stiffness of left shoulder, not elsewhere classified: Secondary | ICD-10-CM

## 2022-10-31 DIAGNOSIS — M25512 Pain in left shoulder: Secondary | ICD-10-CM

## 2022-10-31 DIAGNOSIS — M6281 Muscle weakness (generalized): Secondary | ICD-10-CM

## 2022-10-31 NOTE — Therapy (Signed)
OUTPATIENT PHYSICAL THERAPY UPPER EXTREMITY PROGRESS NOTE    Patient Name: Eric Tapia MRN: [id number] DOB:[birth year], 22 y.o., male Today's Date: 10/23/2022  END OF SESSION:  PT End of Session - 10/23/22 0838     Visit Number 9    Number of Visits 16    Date for PT Re-Evaluation 11/01/22    PT Start Time 1430    PT Stop Time 1513    PT Time Calculation (min) 43 min    Activity Tolerance Patient tolerated treatment well    Behavior During Therapy WFL for tasks assessed/performed               Past Medical History:  Diagnosis Date   Attention deficit hyperactivity disorder (ADHD)    Epistaxis    PONV (postoperative nausea and vomiting)    Past Surgical History:  Procedure Laterality Date   NOSE SURGERY     SHOULDER ARTHROSCOPY WITH LABRAL REPAIR Left 09/02/2022   Procedure: LEFT SHOULDER ARTHROSCOPY WITH LABRAL REPAIR;  Surgeon: Vanetta Mulders, MD;  Location: [facility];  Service: Orthopedics;  Laterality: Left;   Patient Active Problem List   Diagnosis Date Noted   Anterior dislocation of left shoulder 09/02/2022    PCP: No PCP   REFERRING PROVIDER: Dr Vanetta Mulders   REFERRING DIAG:   [phone] (ICD-10-CM) - Anterior dislocation of left shoulder, initial encounter    THERAPY DIAG:  Stiffness of left shoulder, not elsewhere classified  Acute pain of left shoulder  Muscle weakness (generalized)  Rationale for Evaluation and Treatment: Rehabilitation  ONSET DATE: 09/02/2022   Days since surgery: 57  SUBJECTIVE:                                                                                                                                                                                       SUBJECTIVE STATEMENT:  Patient reports some soreness in the beginning of the session. But overall is feeling good. He was sore in his anterior shoulder following the last visit. He states that the quadrecep rocks and the [place] with alternating shoulder flexion  bothered it a little last visit. So we stop that and will review that next visit.  PERTINENT HISTORY: None   PAIN:  Are you having pain? Yes: NPRS scale: 0/10 today.  Pain location: left shoulder  Pain description: aching  Aggravating factors: just hurts at times  Relieving factors: ice   PRECAUTIONS: Shoulder follow instability protocol   WEIGHT BEARING RESTRICTIONS: Yes NWB   FALLS:  Has patient fallen in last 6 months? 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 [employer]  PLOF: Independent  PATIENT GOALS:  To have less pain   NEXT MD VISIT:  2 weeks  OBJECTIVE:     DIAGNOSTIC FINDINGS:  Nothing post op   PATIENT SURVEYS :  FOTO    COGNITION: Overall cognitive status: Within functional limits for tasks assessed     SENSATION: WFL  POSTURE: WNL  UPPER EXTREMITY ROM:   Passive ROM Right eval Left eval Left 3/19  Shoulder flexion  75 142  Shoulder extension     Shoulder abduction   155  Shoulder adduction     Shoulder internal rotation  To belly  61  Shoulder external rotation  6 73  Elbow flexion  WNL    Elbow extension     Wrist flexion     Wrist extension     Wrist ulnar deviation     Wrist radial deviation     Wrist pronation     Wrist supination     (Blank rows = not tested)  Active ROM Right eval Left eval Left 3/19  Shoulder flexion   145  Shoulder extension   140 (compensates)  Shoulder abduction   155  Shoulder adduction     Shoulder internal rotation   T6  Shoulder external rotation   T3  Elbow flexion     Elbow extension     Wrist flexion     Wrist extension     Wrist ulnar deviation     Wrist radial deviation     Wrist pronation     Wrist supination     (Blank rows = not tested)  UPPER EXTREMITY MMT:  MMT Right eval Left eval  Shoulder flexion 26 21  Shoulder extension    Shoulder abduction 24 20.4  Shoulder adduction    Shoulder internal rotation 24.2  20.6  Shoulder external rotation 20.5 17.7  Middle trapezius    Lower trapezius    Elbow flexion    Elbow extension    Wrist flexion    Wrist extension    Wrist ulnar deviation    Wrist radial deviation    Wrist pronation    Wrist supination    Grip strength (lbs)    (Blank rows = not tested) Not tested 2nd to recent surgery    JOINT MOBILITY TESTING:    PALPATION:  No unexpected tenderness to palpation   TODAY'S TREATMENT:                                                                                                                                         DATE: 3/28 Supine active flexion 2x10 3lb Side lying ER 2x10 3lb Supine ABC 1x 3lbs  Rhythmic stabilization x30sec ER /IR and shoulder flexion distal and proximal ea in standing  Rows green band 3x10 Shoulder extension green band 3x10 Standing shoulder flexion 3x10 3lbs Standing scaption 3x10 1lbs   3/26 Reviewed strength measurements  Pulley into flexion and abduction 2 min ea Supine active flexion to 100 2x10 3lb SIde lying ER 2x10 3lb Supine ABC 1x 3lbs  Rhythmic stabilization x30sec ER /IR  ea in standing  Quadrecep rocks x20  Quadreceps alternating arm flexion 2x10  Rows red band 2x10 Shoulder extension 2x10 Prone shoulder row 2x10 Prone extension 2x10  3/19 Updated measurements:  Pulley into flexion and abduction 2 min ea  Grade II and III Posterior and inferior glides.  PROM all planes SIde lying ER 2x10 1lb   Supine ABC 1x 3lbs  Supine active flexion to 100 2x10 1lb Prone I,Y,T 1# 2x10ea Rhythmic stabilization x30sec ea (weaker with side to side)  IR behind back with strap- 10sec x5 Rows- Blue TB 3x10   3/12 Pulley into flexion and abduction 2 min ea  Grade II and III Posterior and inferior glides.  PROM all planes Wand press x20 red  Wand flexion 2x20 red  SIde lying ER 2x15 1lb   Supine ABC 2x 3lbs  Prone I,Y,T 1# 2x10ea Rhythmic stabilization x30sec ea (weaker with side to  side)  IR behind back with strap- 10sec x5  PATIENT EDUCATION: Education details: HEP; progression of activity  Person educated: Patient Education method: Explanation, Demonstration, Tactile cues, Verbal cues, and Handouts Education comprehension: verbalized understanding, returned demonstration, verbal cues required, tactile cues required, and needs further education  HOME EXERCISE PROGRAM: Access Code: [email or web address] URL: [email or web address] Date: 09/08/2022 Prepared by: Carolyne Littles   CLINICAL IMPRESSION: Patient continues to progress well. Patient reports soreness from last session due to quadriceps rocks weight shift. Patient tolerated Rhythmic stabilization in standing both distally and proximal in shoulder flexion. Patient did not do quadriceps rocks weight shift, will try it again next session.  We will continue to advance patient HEP per protocol related to sport specific. He is nearing end range ER.   OBJECTIVE IMPAIRMENTS: decreased knowledge of condition, decreased ROM, decreased strength, impaired UE functional use, and pain.   ACTIVITY LIMITATIONS: carrying, lifting, self feeding, reach over head, and hygiene/grooming  PARTICIPATION LIMITATIONS: meal prep, cleaning, laundry, driving, shopping, community activity, occupation, and school  PERSONAL FACTORS: None .   REHAB POTENTIAL: Excellent  CLINICAL DECISION MAKING: Stable/uncomplicated  EVALUATION COMPLEXITY: Low  GOALS: Goals reviewed with patient? Yes  SHORT TERM GOALS: Target date: 10/18/2022    Patient will increase passive external rotation of the left shoulder to 30 degrees Baseline: Goal status: MET 10/22/22  2.  Patient will increase passive left shoulder flexion to 90 degrees Baseline:  Goal status: MET 10/22/22  3.  Patient will wean out of the sling without significant increase in pain Baseline:  Goal status: MET 10/22/22  4.  Patient will be admitted independent with initial  exercise program Baseline:  Goal status: MET 10/22/22 LONG TERM GOALS: Target date: 11/29/2022    Patient will begin return to throwing program per MD Baseline:  Goal status: INITIAL  2.  Patient will return to base gym program without pain Baseline:  Goal status: INITIAL  3.  Patient will perform all ADLs and IADLs with dominant arm without increased pain Baseline:  Goal status: INITIAL  PLAN:  PT FREQUENCY: 2x/week  PT DURATION: 6 weeks  PLANNED INTERVENTIONS: Therapeutic exercises, Therapeutic activity, Neuromuscular re-education, Patient/Family education, Self Care, Joint mobilization, Aquatic Therapy, Dry Needling, Electrical stimulation, Cryotherapy, Moist heat, Taping, Ultrasound, and Manual therapy  PLAN FOR NEXT SESSION: Continue to work on strengthening and ROM. We will progress as tolerated and as protocol. Patient will  try quadriceps rocks again to see if tolerated.   Check all possible CPT codes: [id number] - PT Re-evaluation, 97110- Therapeutic Exercise, [phone]- Neuro Re-education, [phone] - Gait Training, [phone] - Manual Therapy, 97530 - Therapeutic Activities, [phone] - Self Care, [phone] - Electrical stimulation (unattended), [id number] - Iontophoresis, [id number] - Ultrasound, and [id number] - Aquatic therapy    Check all conditions that are expected to impact treatment: None of these apply   If treatment provided at initial evaluation, no treatment charged due to lack of authorization.      Carolyne Littles PT DPT  10/23/2022, 8:43 AM   Check all possible CPT codes: [phone] - PT Re-evaluation, 97110- Therapeutic Exercise, 97140 - Manual Therapy, 97530 - Therapeutic Activities, 97535 - Self Care, [phone] - Electrical stimulation (unattended), and 97035 - Ultrasound    Check all conditions that are expected to impact treatment: None of these apply   If treatment provided at initial evaluation, no treatment charged due to lack of authorization.

## 2022-11-20 ENCOUNTER — Ambulatory Visit (HOSPITAL_BASED_OUTPATIENT_CLINIC_OR_DEPARTMENT_OTHER): Payer: Medicaid Other | Attending: Orthopaedic Surgery | Admitting: Physical Therapy

## 2022-11-20 ENCOUNTER — Encounter (HOSPITAL_BASED_OUTPATIENT_CLINIC_OR_DEPARTMENT_OTHER): Payer: Self-pay | Admitting: Physical Therapy

## 2022-11-20 DIAGNOSIS — R29898 Other symptoms and signs involving the musculoskeletal system: Secondary | ICD-10-CM | POA: Insufficient documentation

## 2022-11-20 DIAGNOSIS — M25612 Stiffness of left shoulder, not elsewhere classified: Secondary | ICD-10-CM | POA: Diagnosis not present

## 2022-11-20 DIAGNOSIS — M25512 Pain in left shoulder: Secondary | ICD-10-CM | POA: Insufficient documentation

## 2022-11-20 DIAGNOSIS — S43015A Anterior dislocation of left humerus, initial encounter: Secondary | ICD-10-CM | POA: Diagnosis not present

## 2022-11-20 DIAGNOSIS — M6281 Muscle weakness (generalized): Secondary | ICD-10-CM | POA: Diagnosis not present

## 2022-11-20 DIAGNOSIS — M25619 Stiffness of unspecified shoulder, not elsewhere classified: Secondary | ICD-10-CM | POA: Insufficient documentation

## 2022-11-20 NOTE — Therapy (Signed)
OUTPATIENT PHYSICAL THERAPY UPPER EXTREMITY PROGRESS NOTE    Patient Name: Eric Tapia MRN: [id number] DOB:Mar 14, 2001, 22 y.o., male Today's Date: 11/20/2022  END OF SESSION:  PT End of Session - 11/20/22 0949     Visit Number 14    Number of Visits 18    Date for PT Re-Evaluation 12/25/22    PT Start Time 0927    PT Stop Time 1012    PT Time Calculation (min) 45 min    Activity Tolerance Patient tolerated treatment well    Behavior During Therapy [facility] for tasks assessed/performed               Past Medical History:  Diagnosis Date   Attention deficit hyperactivity disorder (ADHD)    Epistaxis    PONV (postoperative nausea and vomiting)    Past Surgical History:  Procedure Laterality Date   NOSE SURGERY     SHOULDER ARTHROSCOPY WITH LABRAL REPAIR Left 09/02/2022   Procedure: LEFT SHOULDER ARTHROSCOPY WITH LABRAL REPAIR;  Surgeon: Huel Cote, MD;  Location: [facility];  Service: Orthopedics;  Laterality: Left;   Patient Active Problem List   Diagnosis Date Noted   Anterior dislocation of left shoulder 09/02/2022    PCP: No PCP   REFERRING PROVIDER: Dr Huel Cote   REFERRING DIAG:   [phone] (ICD-10-CM) - Anterior dislocation of left shoulder, initial encounter    THERAPY DIAG:  Weakness of left shoulder  Limited range of motion (ROM) of shoulder  Rationale for Evaluation and Treatment: Rehabilitation  ONSET DATE: 09/02/2022   Days since surgery: 79  SUBJECTIVE:                                                                                                                                                                                       SUBJECTIVE STATEMENT:  Patient reports no pain at entry of therapy. Patient states that he has been going to the gym and doing light exercises and keeping up with his HEP.  PERTINENT HISTORY: None   PAIN:  Are you having pain? Yes: NPRS scale: 0/10 today.  Pain location: left shoulder  Pain  description: aching  Aggravating factors: just hurts at times  Relieving factors: ice   PRECAUTIONS: Shoulder follow instability protocol   WEIGHT BEARING RESTRICTIONS: Yes NWB   FALLS:  Has patient fallen in last 6 months? 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[employer]   PLOF: Independent  PATIENT GOALS:  To have less pain   NEXT MD VISIT:  2 weeks  OBJECTIVE:     DIAGNOSTIC FINDINGS:  Nothing post op  PATIENT SURVEYS :  FOTO    COGNITION: Overall cognitive status: Within functional limits for tasks assessed     SENSATION: WFL  POSTURE: WNL  UPPER EXTREMITY ROM:   Passive ROM Right eval Left  Left 3/19 Left 4/17   Shoulder flexion  75 142 148  Shoulder extension      Shoulder abduction   155 Full   Shoulder adduction      Shoulder internal rotation  To belly  61   Shoulder external rotation  6 73   Elbow flexion  WNL     Elbow extension      Wrist flexion      Wrist extension      Wrist ulnar deviation      Wrist radial deviation      Wrist pronation      Wrist supination      (Blank rows = not tested)  Active ROM Right eval Left eval Left 3/19 Left  4/17  Shoulder flexion   145 140  Shoulder extension   140 (compensates)   Shoulder abduction   155   Shoulder adduction      Shoulder internal rotation   T6 T6  Shoulder external rotation   T3 T3  Elbow flexion      Elbow extension      Wrist flexion      Wrist extension      Wrist ulnar deviation      Wrist radial deviation      Wrist pronation      Wrist supination      (Blank rows = not tested)  UPPER EXTREMITY MMT:  MMT Right  Left  Right 4/17 Left 4/17  Shoulder flexion 26 21 27.5 25.6  Shoulder extension      Shoulder abduction 24 20.4    Shoulder adduction      Shoulder internal rotation 24.2 20.6 32.5 30.3  Shoulder external rotation 20.5 17.7 26.5 25.7  Middle trapezius      Lower trapezius      Elbow  flexion      Elbow extension      Wrist flexion      Wrist extension      Wrist ulnar deviation      Wrist radial deviation      Wrist pronation      Wrist supination      Grip strength (lbs)      (Blank rows = not tested) Not tested 2nd to recent surgery    JOINT MOBILITY TESTING:    PALPATION:  No unexpected tenderness to palpation   TODAY'S TREATMENT:                                                                                                                                         DATE:  4/17 Arm bike 2 mins forward/ backward each Supine ABC x2 3lbs  Sidelying  ER 2x10 5lbs  Rows blue band 3x12  Shoulder extension blue band  Rhtymic stabilization x 30 sec ER/IR and shoulder flexion distal and proximal each in standing.  Ball roll up/down 2x20 Ball roll side to side 2x20 Ball roll clockwise/counterclockwise x15 each  Reviewed measurement    3/28 Supine active flexion 2x10 3lb Side lying ER 2x10 3lb Supine ABC 1x 3lbs  Rhythmic stabilization x30sec ER /IR and shoulder flexion distal and proximal ea in standing  Rows green band 3x10 Shoulder extension green band 3x10 Standing shoulder flexion 3x10 3lbs Standing scaption 3x10 1lbs    3/26 Reviewed strength measurements   Pulley into flexion and abduction 2 min ea Supine active flexion to 100 2x10 3lb SIde lying ER 2x10 3lb Supine ABC 1x 3lbs  Rhythmic stabilization x30sec ER /IR  ea in standing  Quadrecep rocks x20  Quadreceps alternating arm flexion 2x10  Rows red band 2x10 Shoulder extension 2x10 Prone shoulder row 2x10 Prone extension 2x10  3/19 Updated measurements:  Pulley into flexion and abduction 2 min ea  Grade II and III Posterior and inferior glides.  PROM all planes SIde lying ER 2x10 1lb   Supine ABC 1x 3lbs  Supine active flexion to 100 2x10 1lb Prone I,Y,T 1# 2x10ea Rhythmic stabilization x30sec ea (weaker with side to side)  IR behind back with strap- 10sec x5 Rows- Blue  TB 3x10   3/12 Pulley into flexion and abduction 2 min ea  Grade II and III Posterior and inferior glides.  PROM all planes Wand press x20 red  Wand flexion 2x20 red  SIde lying ER 2x15 1lb   Supine ABC 2x 3lbs  Prone I,Y,T 1# 2x10ea Rhythmic stabilization x30sec ea (weaker with side to side)  IR behind back with strap- 10sec x5  PATIENT EDUCATION: Education details: HEP; progression of activity  Person educated: Patient Education method: Explanation, Demonstration, Tactile cues, Verbal cues, and Handouts Education comprehension: verbalized understanding, returned demonstration, verbal cues required, tactile cues required, and needs further education  HOME EXERCISE PROGRAM: Access Code: [email or web address] URL: [email or web address] Date: 09/08/2022 Prepared by: Lorayne Bender   CLINICAL IMPRESSION: Patient continues to progress well. Patient has not had any pain but reports that he is still having some clicking and popping. We kept patient treatment session consistent but were able to increase the weight. Patient performed rows and shoulder extension with blue band, states that it was challenging but not painful. Added 2 new exercise 3 way shoulder flexion with red band and ball rolls to build on muscular endurance.Tested patient strength and he has increased in strength.Patient ROM remains to be [facility]. We will continue to advance patient HEP per protocol related to sport specific.   OBJECTIVE IMPAIRMENTS: decreased knowledge of condition, decreased ROM, decreased strength, impaired UE functional use, and pain.   ACTIVITY LIMITATIONS: carrying, lifting, self feeding, reach over head, and hygiene/grooming  PARTICIPATION LIMITATIONS: meal prep, cleaning, laundry, driving, shopping, community activity, occupation, and school  PERSONAL FACTORS: None .   REHAB POTENTIAL: Excellent  CLINICAL DECISION MAKING: Stable/uncomplicated  EVALUATION COMPLEXITY: Low  GOALS: Goals  reviewed with patient? Yes  SHORT TERM GOALS: Target date: 10/18/2022    Patient will increase passive external rotation of the left shoulder to 30 degrees Baseline: Goal status: MET 10/22/22  2.  Patient will increase passive left shoulder flexion to 90 degrees Baseline:  Goal status: MET 10/22/22  3.  Patient will wean out of the sling without significant increase in pain Baseline:  Goal status: MET  10/22/22  4.  Patient will be admitted independent with initial exercise program Baseline:  Goal status: MET 10/22/22 LONG TERM GOALS: Target date: 11/29/2022    Patient will begin return to throwing program per MD Baseline:  Goal status: INITIAL  2.  Patient will return to base gym program without pain Baseline:  Goal status: INITIAL  3.  Patient will perform all ADLs and IADLs with dominant arm without increased pain Baseline:  Goal status: INITIAL  PLAN:  PT FREQUENCY: 2x/week  PT DURATION: 6 weeks  PLANNED INTERVENTIONS: Therapeutic exercises, Therapeutic activity, Neuromuscular re-education, Patient/Family education, Self Care, Joint mobilization, Aquatic Therapy, Dry Needling, Electrical stimulation, Cryotherapy, Moist heat, Taping, Ultrasound, and Manual therapy  PLAN FOR NEXT SESSION: Continue to work on strengthening and ROM sports specific. Consider body blade in ER/IR position and shoulder flexion. Consider triceps extension with yellow band.   Check all possible CPT codes: 13086 - PT Re-evaluation, 97110- Therapeutic Exercise, [id number]- Neuro Re-education, [phone] - Gait Training, [phone] - Manual Therapy, 97530 - Therapeutic Activities, [phone] - Self Care, [phone] - Electrical stimulation (unattended), [id number] - Iontophoresis, [id number] - Ultrasound, and [id number] - Aquatic therapy    Check all conditions that are expected to impact treatment: None of these apply   If treatment provided at initial evaluation, no treatment charged due to lack of authorization.      Lorayne Bender  PT DPT  11/20/2022, 2:25 PM   Check all possible CPT codes: 36644 - PT Re-evaluation, 97110- Therapeutic Exercise, 97140 - Manual Therapy, 97530 - Therapeutic Activities, 97535 - Self Care, [phone] - Electrical stimulation (unattended), and 97035 - Ultrasound    Check all conditions that are expected to impact treatment: None of these apply   If treatment provided at initial evaluation, no treatment charged due to lack of authorization.

## 2022-11-21 ENCOUNTER — Ambulatory Visit (INDEPENDENT_AMBULATORY_CARE_PROVIDER_SITE_OTHER): Payer: Medicaid Other | Admitting: Orthopaedic Surgery

## 2022-11-21 DIAGNOSIS — S43015A Anterior dislocation of left humerus, initial encounter: Secondary | ICD-10-CM

## 2022-11-21 NOTE — Progress Notes (Signed)
Post Operative Evaluation    Procedure/Date of Surgery: Left shoulder arthroscopic labral repair 1/29  Interval History:    Presents today 65-month status post left shoulder arthroscopic labral repair overall doing very well.  He has no pain.  There is an occasional clicking although this is not painful.  He does not feel unstable in any capacity.  Overhead motion is normalized and he is working on continuing to strengthen.  PMH/PSH/Family History/Social History/Meds/Allergies:    Past Medical History:  Diagnosis Date   Attention deficit hyperactivity disorder (ADHD)    Epistaxis    PONV (postoperative nausea and vomiting)    Past Surgical History:  Procedure Laterality Date   NOSE SURGERY     SHOULDER ARTHROSCOPY WITH LABRAL REPAIR Left 09/02/2022   Procedure: LEFT SHOULDER ARTHROSCOPY WITH LABRAL REPAIR;  Surgeon: Huel Cote, MD;  Location: [facility];  Service: Orthopedics;  Laterality: Left;   Social History   Socioeconomic History   Marital status: Single    Spouse name: Not on file   Number of children: Not on file   Years of education: Not on file   Highest education level: Not on file  Occupational History   Not on file  Tobacco Use   Smoking status: Never    Passive exposure: Yes   Smokeless tobacco: Never  Vaping Use   Vaping Use: Not on file  Substance and Sexual Activity   Alcohol use: No   Drug use: No   Sexual activity: Not Currently  Other Topics Concern   Not on file  Social History Narrative   Not on file   Social Determinants of Health   Financial Resource Strain: Not on file  Food Insecurity: Not on file  Transportation Needs: Not on file  Physical Activity: Not on file  Stress: Not on file  Social Connections: Not on file   No family history on file. No Known Allergies Current Outpatient Medications  Medication Sig Dispense Refill   acetaminophen (TYLENOL) 500 MG tablet Take 1,000 mg by mouth every 6  (six) hours as needed for moderate pain.     aspirin EC 325 MG tablet Take 1 tablet (325 mg total) by mouth daily. 30 tablet 0   ibuprofen (ADVIL) 200 MG tablet Take 400 mg by mouth every 6 (six) hours as needed for moderate pain.     Melatonin 10 MG TABS Take 10 mg by mouth at bedtime as needed (sleep).     Menthol, Topical Analgesic, (MINERAL ICE EX) Apply 1 Application topically daily as needed (pain).     oxyCODONE (OXY IR/ROXICODONE) 5 MG immediate release tablet Take 1 tablet (5 mg total) by mouth every 4 (four) hours as needed (severe pain). 10 tablet 0   No current facility-administered medications for this visit.   No results found.  Review of Systems:   A ROS was performed including pertinent positives and negatives as documented in the HPI.   Musculoskeletal Exam:    Left shoulder incisions are well-appearing without erythema or drainage.  Range of motion is sitting position is forward elevation of 170 degrees equal to the contralateral side with external rotation at side to 60 degrees again except contralateral side.  Internal rotation is to T12 bilaterally without pain.  Negative apprehension.  1+ anterior posterior load shift, negative O'Brien . 2+  radial pulse  Imaging:      I personally reviewed and interpreted the radiographs.   Assessment:   73-month status post left shoulder arthroscopic labral repair doing well.  This time I will plan to order additional physical therapy for strengthening of the left shoulder.  I will plan to see him back in 6 weeks I will clear him at that time for a return to throwing program  Plan :    -Return to clinic in 6 weeks      I personally saw and evaluated the patient, and participated in the management and treatment plan.  Huel Cote, MD Attending Physician, Orthopedic Surgery  This document was dictated using Dragon voice recognition software. A reasonable attempt at proof reading has been made to minimize errors.

## 2022-11-26 ENCOUNTER — Ambulatory Visit (HOSPITAL_BASED_OUTPATIENT_CLINIC_OR_DEPARTMENT_OTHER): Payer: Medicaid Other | Admitting: Physical Therapy

## 2022-11-26 ENCOUNTER — Encounter (HOSPITAL_BASED_OUTPATIENT_CLINIC_OR_DEPARTMENT_OTHER): Payer: Self-pay | Admitting: Physical Therapy

## 2022-11-26 DIAGNOSIS — M25512 Pain in left shoulder: Secondary | ICD-10-CM

## 2022-11-26 DIAGNOSIS — M6281 Muscle weakness (generalized): Secondary | ICD-10-CM | POA: Diagnosis not present

## 2022-11-26 DIAGNOSIS — M25612 Stiffness of left shoulder, not elsewhere classified: Secondary | ICD-10-CM

## 2022-11-26 DIAGNOSIS — R29898 Other symptoms and signs involving the musculoskeletal system: Secondary | ICD-10-CM | POA: Diagnosis not present

## 2022-11-26 DIAGNOSIS — S43015A Anterior dislocation of left humerus, initial encounter: Secondary | ICD-10-CM | POA: Diagnosis not present

## 2022-11-26 DIAGNOSIS — M25619 Stiffness of unspecified shoulder, not elsewhere classified: Secondary | ICD-10-CM | POA: Diagnosis not present

## 2022-11-26 NOTE — Therapy (Signed)
OUTPATIENT PHYSICAL THERAPY UPPER EXTREMITY PROGRESS NOTE    Patient Name: Eric Tapia MRN: [id number] DOB:Aug 02, 2001, 22 y.o., male Today's Date: 11/26/2022  END OF SESSION:  PT End of Session - 11/26/22 0839     Visit Number 15    Number of Visits 18    Date for PT Re-Evaluation 12/25/22    PT Start Time 0801    PT Stop Time 0844    PT Time Calculation (min) 43 min    Activity Tolerance Patient tolerated treatment well    Behavior During Therapy [facility] for tasks assessed/performed               Past Medical History:  Diagnosis Date   Attention deficit hyperactivity disorder (ADHD)    Epistaxis    PONV (postoperative nausea and vomiting)    Past Surgical History:  Procedure Laterality Date   NOSE SURGERY     SHOULDER ARTHROSCOPY WITH LABRAL REPAIR Left 09/02/2022   Procedure: LEFT SHOULDER ARTHROSCOPY WITH LABRAL REPAIR;  Surgeon: Huel Cote, MD;  Location: [facility] OR;  Service: Orthopedics;  Laterality: Left;   Patient Active Problem List   Diagnosis Date Noted   Anterior dislocation of left shoulder 09/02/2022    PCP: No PCP   REFERRING PROVIDER: Dr Huel Cote   REFERRING DIAG:   [phone] (ICD-10-CM) - Anterior dislocation of left shoulder, initial encounter    THERAPY DIAG:  Weakness of left shoulder  Stiffness of left shoulder, not elsewhere classified  Acute pain of left shoulder  Muscle weakness (generalized)  Rationale for Evaluation and Treatment: Rehabilitation  ONSET DATE: 09/02/2022   Days since surgery: 85  SUBJECTIVE:                                                                                                                                                                                       SUBJECTIVE STATEMENT:  Patient reports no pain at entry of therapy. Patient reports he still has clicking and popping here and there but is not painful. Patient was informed by MD that it will still be a couple of months until he can  throw. Patient is not able to return to work at the moment.   PERTINENT HISTORY: None   PAIN:  Are you having pain? Yes: NPRS scale: 0/10 today.  Pain location: left shoulder  Pain description: aching  Aggravating factors: just hurts at times  Relieving factors: ice   PRECAUTIONS: Shoulder follow instability protocol   WEIGHT BEARING RESTRICTIONS: Yes NWB   FALLS:  Has patient fallen in last 6 months? 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[employer]   PLOF: Independent  PATIENT GOALS:  To have less pain   NEXT MD VISIT:  2 weeks  OBJECTIVE:     DIAGNOSTIC FINDINGS:  Nothing post op   PATIENT SURVEYS :  FOTO    COGNITION: Overall cognitive status: Within functional limits for tasks assessed     SENSATION: WFL  POSTURE: WNL  UPPER EXTREMITY ROM:   Passive ROM Right eval Left  Left 3/19 Left 4/17   Shoulder flexion  75 142 148  Shoulder extension      Shoulder abduction   155 Full   Shoulder adduction      Shoulder internal rotation  To belly  61   Shoulder external rotation  6 73   Elbow flexion  WNL     Elbow extension      Wrist flexion      Wrist extension      Wrist ulnar deviation      Wrist radial deviation      Wrist pronation      Wrist supination      (Blank rows = not tested)  Active ROM Right eval Left eval Left 3/19 Left  4/17  Shoulder flexion   145 140  Shoulder extension   140 (compensates)   Shoulder abduction   155   Shoulder adduction      Shoulder internal rotation   T6 T6  Shoulder external rotation   T3 T3  Elbow flexion      Elbow extension      Wrist flexion      Wrist extension      Wrist ulnar deviation      Wrist radial deviation      Wrist pronation      Wrist supination      (Blank rows = not tested)  UPPER EXTREMITY MMT:  MMT Right  Left  Right 4/17 Left 4/17  Shoulder flexion 26 21 27.5 25.6  Shoulder extension      Shoulder abduction  24 20.4    Shoulder adduction      Shoulder internal rotation 24.2 20.6 32.5 30.3  Shoulder external rotation 20.5 17.7 26.5 25.7  Middle trapezius      Lower trapezius      Elbow flexion      Elbow extension      Wrist flexion      Wrist extension      Wrist ulnar deviation      Wrist radial deviation      Wrist pronation      Wrist supination      Grip strength (lbs)      (Blank rows = not tested) Not tested 2nd to recent surgery    JOINT MOBILITY TESTING:    PALPATION:  No unexpected tenderness to palpation   TODAY'S TREATMENT:  DATE:  4/23 Arm bike 2 mins forward/backward each  Supine abc x1 4lbs Sidelying ER 3x8 5lbs Quadrecep rocks x20  Quadreceps alternating arm flexion 2x8  Rhythmic stabilization ER/IR and shoulder flexion distal and proximal each in standing.  Standing 90/90 ER with yellow band x2 until fatigue  Cable rows 3x10 15lbs Cable extension 3x10 15lbs    4/17 Arm bike 2 mins forward/ backward each Supine ABC x2 3lbs  Sidelying ER 2x10 5lbs  Rows blue band 3x12  Shoulder extension blue band  Rhtymic stabilization x 30 sec ER/IR and shoulder flexion distal and proximal each in standing.  Ball roll up/down 2x20 Ball roll side to side 2x20 [employer] x15 each  Reviewed measurement    3/28 Supine active flexion 2x10 3lb Side lying ER 2x10 3lb Supine ABC 1x 3lbs  Rhythmic stabilization x30sec ER /IR and shoulder flexion distal and proximal ea in standing  Rows green band 3x10 Shoulder extension green band 3x10 Standing shoulder flexion 3x10 3lbs Standing scaption 3x10 1lbs    3/26 Reviewed strength measurements   Pulley into flexion and abduction 2 min ea Supine active flexion to 100 2x10 3lb SIde lying ER 2x10 3lb Supine ABC 1x 3lbs  Rhythmic stabilization x30sec ER /IR  ea  in standing  Quadrecep rocks x20  Quadreceps alternating arm flexion 2x10  Rows red band 2x10 Shoulder extension 2x10 Prone shoulder row 2x10 Prone extension 2x10   PATIENT EDUCATION: Education details: HEP; progression of activity  Person educated: Patient Education method: Explanation, Demonstration, Tactile cues, Verbal cues, and Handouts Education comprehension: verbalized understanding, returned demonstration, verbal cues required, tactile cues required, and needs further education  HOME EXERCISE PROGRAM: Access Code: [email or web address] URL: [email or web address] Date: 09/08/2022 Prepared by: Lorayne Bender   CLINICAL IMPRESSION: Patient continues to progress really well. Patient reports no pain at entry but is still having some popping and clicking. 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  Patient session was kept consistent but was able to increase the weight for supine abc. We revisit quaderceps rock and quadreceps alternating arm flexion patient tolerated well with no discomfort. Patient ROM remains to be [facility]. Patient was demonstrated and given a few overhead the exercising to continue working on strengthening. Patient was also progress to cable machines for rows and shoulder extension. Continue to work on Copywriter, advertising and progress patient as tolerated and protocol.    OBJECTIVE IMPAIRMENTS: decreased knowledge of condition, decreased ROM, decreased strength, impaired UE functional use, and pain.   ACTIVITY LIMITATIONS: carrying, lifting, self feeding, reach over head, and hygiene/grooming  PARTICIPATION LIMITATIONS: meal prep, cleaning, laundry, driving, shopping, community activity, occupation, and school  PERSONAL FACTORS: None .   REHAB POTENTIAL: Excellent  CLINICAL DECISION MAKING: Stable/uncomplicated  EVALUATION COMPLEXITY: Low  GOALS: Goals reviewed with patient? Yes  SHORT TERM GOALS: Target date: 10/18/2022    Patient will  increase passive external rotation of the left shoulder to 30 degrees Baseline: Goal status: MET 10/22/22  2.  Patient will increase passive left shoulder flexion to 90 degrees Baseline:  Goal status: MET 10/22/22  3.  Patient will wean out of the sling without significant increase in pain Baseline:  Goal status: MET 10/22/22  4.  Patient will be admitted independent with initial exercise program Baseline:  Goal status: MET 10/22/22 LONG TERM GOALS: Target date: 11/29/2022    Patient will begin return to throwing program per MD Baseline:  Goal status: INITIAL  2.  Patient will return  to base gym program without pain Baseline:  Goal status: INITIAL  3.  Patient will perform all ADLs and IADLs with dominant arm without increased pain Baseline:  Goal status: INITIAL  PLAN:  PT FREQUENCY: 2x/week  PT DURATION: 6 weeks  PLANNED INTERVENTIONS: Therapeutic exercises, Therapeutic activity, Neuromuscular re-education, Patient/Family education, Self Care, Joint mobilization, Aquatic Therapy, Dry Needling, Electrical stimulation, Cryotherapy, Moist heat, Taping, Ultrasound, and Manual therapy  PLAN FOR NEXT SESSION: Consider body blade in ER/IR position and shoulder flexion to work on muscular endurance. Consider triceps extension with yellow band. Progressing overhead ER band. Patient has no more visits, patient states that he will schedule once a week.    Check all possible CPT codes: 40981 - PT Re-evaluation, 97110- Therapeutic Exercise, [phone]- Neuro Re-education, [phone] - Gait Training, [phone] - Manual Therapy, 97530 - Therapeutic Activities, [phone] - Self Care, [phone] - Electrical stimulation (unattended), [id number] - Iontophoresis, [id number] - Ultrasound, and [id number] - Aquatic therapy    Check all conditions that are expected to impact treatment: None of these apply   If treatment provided at initial evaluation, no treatment charged due to lack of authorization.      Lorayne Bender PT DPT   11/26/2022, 10:58 AM    Cristal Ford SPT  11/26/2022  During this treatment session, the therapist was present, participating in and directing the treatment.  I have reviewed and concur with this student's documentation.    Check all possible CPT codes: 52841 - PT Re-evaluation, 97110- Therapeutic Exercise, 97140 - Manual Therapy, 97530 - Therapeutic Activities, 97535 - Self Care, [phone] - Electrical stimulation (unattended), and [phone] - Ultrasound    Check all conditions that are expected to impact treatment: None of these apply   If treatment provided at initial evaluation, no treatment charged due to lack of authorization.

## 2023-01-08 ENCOUNTER — Ambulatory Visit (INDEPENDENT_AMBULATORY_CARE_PROVIDER_SITE_OTHER): Payer: Medicaid Other | Admitting: Orthopaedic Surgery

## 2023-01-08 DIAGNOSIS — S43015A Anterior dislocation of left humerus, initial encounter: Secondary | ICD-10-CM

## 2023-01-08 NOTE — Progress Notes (Signed)
Post Operative Evaluation    Procedure/Date of Surgery: Left shoulder arthroscopic labral repair 1/29  Interval History:    Presents today 62-month status post left shoulder arthroscopic labral repair overall doing very well.  He has no pain.  There is an occasional clicking although this is not painful.  He does not feel unstable in any capacity.  Overhead motion is normalized and he is working on continuing to strengthen.  PMH/PSH/Family History/Social History/Meds/Allergies:    Past Medical History:  Diagnosis Date   Attention deficit hyperactivity disorder (ADHD)    Epistaxis    PONV (postoperative nausea and vomiting)    Past Surgical History:  Procedure Laterality Date   NOSE SURGERY     SHOULDER ARTHROSCOPY WITH LABRAL REPAIR Left 09/02/2022   Procedure: LEFT SHOULDER ARTHROSCOPY WITH LABRAL REPAIR;  Surgeon: Huel Cote, MD;  Location: [facility] OR;  Service: Orthopedics;  Laterality: Left;   Social History   Socioeconomic History   Marital status: Single    Spouse name: Not on file   Number of children: Not on file   Years of education: Not on file   Highest education level: Not on file  Occupational History   Not on file  Tobacco Use   Smoking status: Never    Passive exposure: Yes   Smokeless tobacco: Never  Vaping Use   Vaping Use: Not on file  Substance and Sexual Activity   Alcohol use: No   Drug use: No   Sexual activity: Not Currently  Other Topics Concern   Not on file  Social History Narrative   Not on file   Social Determinants of Health   Financial Resource Strain: Not on file  Food Insecurity: Not on file  Transportation Needs: Not on file  Physical Activity: Not on file  Stress: Not on file  Social Connections: Not on file   No family history on file. No Known Allergies Current Outpatient Medications  Medication Sig Dispense Refill   acetaminophen (TYLENOL) 500 MG tablet Take 1,000 mg by mouth every 6  (six) hours as needed for moderate pain.     aspirin EC 325 MG tablet Take 1 tablet (325 mg total) by mouth daily. 30 tablet 0   ibuprofen (ADVIL) 200 MG tablet Take 400 mg by mouth every 6 (six) hours as needed for moderate pain.     Melatonin 10 MG TABS Take 10 mg by mouth at bedtime as needed (sleep).     Menthol, Topical Analgesic, (MINERAL ICE EX) Apply 1 Application topically daily as needed (pain).     oxyCODONE (OXY IR/ROXICODONE) 5 MG immediate release tablet Take 1 tablet (5 mg total) by mouth every 4 (four) hours as needed (severe pain). 10 tablet 0   No current facility-administered medications for this visit.   No results found.  Review of Systems:   A ROS was performed including pertinent positives and negatives as documented in the HPI.   Musculoskeletal Exam:    Left shoulder incisions are well-appearing without erythema or drainage.  Range of motion is sitting position is forward elevation of 170 degrees equal to the contralateral side with external rotation at side to 60 degrees again except contralateral side.  Internal rotation is to T12 bilaterally without pain.  Negative apprehension.  1+ anterior posterior load shift, negative O'Brien . 2+  radial pulse  Imaging:      I personally reviewed and interpreted the radiographs.   Assessment:   61-month status post left shoulder arthroscopic labral repair doing well.  He is feeling a pop which appears to be his biceps tendon although this is not painful.  At this time I do believe that he is ready for a return to throwing program.  I did offer him an ultrasound-guided injection of the biceps to resolve his popping although he has deferred this today as this is not painful.  We will plan for return to throwing progression and he may advance as tolerated.  Plan :    -Return to clinic in 12 weeks      I personally saw and evaluated the patient, and participated in the management and treatment plan.  Huel Cote,  MD Attending Physician, Orthopedic Surgery  This document was dictated using Dragon voice recognition software. A reasonable attempt at proof reading has been made to minimize errors.

## 2023-01-09 ENCOUNTER — Ambulatory Visit (HOSPITAL_BASED_OUTPATIENT_CLINIC_OR_DEPARTMENT_OTHER): Payer: Medicaid Other | Attending: Orthopaedic Surgery | Admitting: Physical Therapy

## 2023-01-09 DIAGNOSIS — S43015A Anterior dislocation of left humerus, initial encounter: Secondary | ICD-10-CM | POA: Diagnosis present

## 2023-01-09 DIAGNOSIS — M25512 Pain in left shoulder: Secondary | ICD-10-CM | POA: Insufficient documentation

## 2023-01-09 DIAGNOSIS — M25612 Stiffness of left shoulder, not elsewhere classified: Secondary | ICD-10-CM | POA: Insufficient documentation

## 2023-01-09 DIAGNOSIS — M6281 Muscle weakness (generalized): Secondary | ICD-10-CM | POA: Insufficient documentation

## 2023-01-09 DIAGNOSIS — R29898 Other symptoms and signs involving the musculoskeletal system: Secondary | ICD-10-CM | POA: Diagnosis present

## 2023-01-09 NOTE — Therapy (Signed)
OUTPATIENT PHYSICAL THERAPY UPPER EXTREMITY PROGRESS NOTE    Patient Name: Eric Tapia MRN: [id number] DOB:[birth year], 22 y.o., male Today's Date: 11/26/2022  END OF SESSION:  PT End of Session - 11/26/22 0839     Visit Number 15    Number of Visits 18    Date for PT Re-Evaluation 12/25/22    PT Start Time 0801    PT Stop Time 0844    PT Time Calculation (min) 43 min    Activity Tolerance Patient tolerated treatment well    Behavior During Therapy [facility] for tasks assessed/performed               Past Medical History:  Diagnosis Date   Attention deficit hyperactivity disorder (ADHD)    Epistaxis    PONV (postoperative nausea and vomiting)    Past Surgical History:  Procedure Laterality Date   NOSE SURGERY     SHOULDER ARTHROSCOPY WITH LABRAL REPAIR Left 09/02/2022   Procedure: LEFT SHOULDER ARTHROSCOPY WITH LABRAL REPAIR;  Surgeon: Huel Cote, MD;  Location: [facility] OR;  Service: Orthopedics;  Laterality: Left;   Patient Active Problem List   Diagnosis Date Noted   Anterior dislocation of left shoulder 09/02/2022    PCP: No PCP   REFERRING PROVIDER: Dr Huel Cote   REFERRING DIAG:   [phone] (ICD-10-CM) - Anterior dislocation of left shoulder, initial encounter    THERAPY DIAG:  Weakness of left shoulder  Stiffness of left shoulder, not elsewhere classified  Acute pain of left shoulder  Muscle weakness (generalized)  Rationale for Evaluation and Treatment: Rehabilitation  ONSET DATE: 09/02/2022   Days since surgery: 85  SUBJECTIVE:                                                                                                                                                                                       SUBJECTIVE STATEMENT:  The patient returns after being cleared by the MD. He is ready for a throwing program.    PERTINENT HISTORY: None   PAIN:  Are you having pain? Yes: NPRS scale: 0/10 today.  Pain location: left shoulder   Pain description: aching  Aggravating factors: just hurts at times  Relieving factors: ice   PRECAUTIONS: Shoulder follow instability protocol   WEIGHT BEARING RESTRICTIONS: Yes NWB   FALLS:  Has patient fallen in last 6 months? 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[employer]   PLOF: Independent  PATIENT GOALS:  To have less pain   NEXT MD VISIT:  2 weeks  OBJECTIVE:     DIAGNOSTIC FINDINGS:  Nothing post op  PATIENT SURVEYS :  FOTO    COGNITION: Overall cognitive status: Within functional limits for tasks assessed     SENSATION: WFL  POSTURE: WNL  UPPER EXTREMITY ROM:   Passive ROM Right eval Left  Left 3/19 Left 4/17   Shoulder flexion  75 142 148  Shoulder extension      Shoulder abduction   155 Full   Shoulder adduction      Shoulder internal rotation  To belly  61   Shoulder external rotation  6 73   Elbow flexion  WNL     Elbow extension      Wrist flexion      Wrist extension      Wrist ulnar deviation      Wrist radial deviation      Wrist pronation      Wrist supination      (Blank rows = not tested)  Active ROM Right eval Left eval Left 3/19 Left  4/17  Shoulder flexion   145 140  Shoulder extension   140 (compensates)   Shoulder abduction   155   Shoulder adduction      Shoulder internal rotation   T6 T6  Shoulder external rotation   T3 T3  Elbow flexion      Elbow extension      Wrist flexion      Wrist extension      Wrist ulnar deviation      Wrist radial deviation      Wrist pronation      Wrist supination      (Blank rows = not tested)  UPPER EXTREMITY MMT:  MMT Right  Left  Right 4/17 Left 4/17  Shoulder flexion 26 21 27.5 25.6  Shoulder extension      Shoulder abduction 24 20.4    Shoulder adduction      Shoulder internal rotation 24.2 20.6 32.5 30.3  Shoulder external rotation 20.5 17.7 26.5 25.7  Middle trapezius      Lower trapezius       Elbow flexion      Elbow extension      Wrist flexion      Wrist extension      Wrist ulnar deviation      Wrist radial deviation      Wrist pronation      Wrist supination      Grip strength (lbs)      (Blank rows = not tested) Not tested 2nd to recent surgery    JOINT MOBILITY TESTING:    PALPATION:  No unexpected tenderness to palpation   TODAY'S TREATMENT:                                                                                                                                         DATE: 6/6 Reviewed [facility] return to throwing program  Reviewed throwers ten program  At side:   ER IR  Both x20 blue band RPE of 5   D1 and D2  Both green band  RPE of 6   Overhead forward blue RPE of 6 x20  ER x20 [employer] and extension 30 lbs  3x15   Ball v wall overhead 5 lbs ball 2x20 cw/ccw/ flexion  and side to side    4/23 Arm bike 2 mins forward/backward each  Supine abc x1 4lbs Sidelying ER 3x8 5lbs Quadrecep rocks x20  Quadreceps alternating arm flexion 2x8  Rhythmic stabilization ER/IR and shoulder flexion distal and proximal each in standing.  Standing 90/90 ER with yellow band x2 until fatigue  Cable rows 3x10 15lbs Cable extension 3x10 15lbs    4/17 Arm bike 2 mins forward/ backward each Supine ABC x2 3lbs  Sidelying ER 2x10 5lbs  Rows blue band 3x12  Shoulder extension blue band  Rhtymic stabilization x 30 sec ER/IR and shoulder flexion distal and proximal each in standing.  Ball roll up/down 2x20 Ball roll side to side 2x20 [employer] x15 each  Reviewed measurement    3/28 Supine active flexion 2x10 3lb Side lying ER 2x10 3lb Supine ABC 1x 3lbs  Rhythmic stabilization x30sec ER /IR and shoulder flexion distal and proximal ea in standing  Rows green band 3x10 Shoulder extension green band 3x10 Standing shoulder flexion 3x10 3lbs Standing scaption 3x10 1lbs    3/26 Reviewed strength  measurements   Pulley into flexion and abduction 2 min ea Supine active flexion to 100 2x10 3lb SIde lying ER 2x10 3lb Supine ABC 1x 3lbs  Rhythmic stabilization x30sec ER /IR  ea in standing  Quadrecep rocks x20  Quadreceps alternating arm flexion 2x10  Rows red band 2x10 Shoulder extension 2x10 Prone shoulder row 2x10 Prone extension 2x10   PATIENT EDUCATION: Education details: HEP; progression of activity  Person educated: Patient Education method: Explanation, Demonstration, Tactile cues, Verbal cues, and Handouts Education comprehension: verbalized understanding, returned demonstration, verbal cues required, tactile cues required, and needs further education  HOME EXERCISE PROGRAM: Access Code: [email or web address] URL: [email or web address] Date: 09/08/2022 Prepared by: Lorayne Bender   CLINICAL IMPRESSION:  The patient is progressing well. He comes in following self guided work for about a month. Per MD her  is ready for return to throwing. We gave him Georgiann Mohs and Hulan Saas return to sport program. He demonstrated a good understanding. He was advised when he gets to the mound portion he may have to go somewhere with a mound. He tolerated his other exercises well today.    OBJECTIVE IMPAIRMENTS: decreased knowledge of condition, decreased ROM, decreased strength, impaired UE functional use, and pain.   ACTIVITY LIMITATIONS: carrying, lifting, self feeding, reach over head, and hygiene/grooming  PARTICIPATION LIMITATIONS: meal prep, cleaning, laundry, driving, shopping, community activity, occupation, and school  PERSONAL FACTORS: None .   REHAB POTENTIAL: Excellent  CLINICAL DECISION MAKING: Stable/uncomplicated  EVALUATION COMPLEXITY: Low  GOALS: Goals reviewed with patient? Yes  SHORT TERM GOALS: Target date: 10/18/2022    Patient will increase passive external rotation of the left shoulder to 30 degrees Baseline: Goal status: MET 10/22/22  2.   Patient will increase passive left shoulder flexion to 90 degrees Baseline:  Goal status: MET 10/22/22  3.  Patient will wean out of the sling without significant increase in pain Baseline:  Goal status: MET 10/22/22  4.  Patient will be admitted independent with initial exercise program Baseline:  Goal status: MET  10/22/22 LONG TERM GOALS: Target date: 11/29/2022    Patient will begin return to throwing program per MD Baseline:  Goal status: stated today 6/6   2.  Patient will return to base gym program without pain Baseline:  Goal status: performing gym activity  without pain 6/6  3.  Patient will perform all ADLs and IADLs with dominant arm without increased pain Baseline:  Goal status: achieved 6/6   PLAN:  PT FREQUENCY: 2x/week  PT DURATION: 6 weeks  PLANNED INTERVENTIONS: Therapeutic exercises, Therapeutic activity, Neuromuscular re-education, Patient/Family education, Self Care, Joint mobilization, Aquatic Therapy, Dry Needling, Electrical stimulation, Cryotherapy, Moist heat, Taping, Ultrasound, and Manual therapy  PLAN FOR NEXT SESSION: Consider body blade in ER/IR position and shoulder flexion to work on muscular endurance. Consider triceps extension with yellow band. Progressing overhead ER band. Patient has no more visits, patient states that he will schedule once a week.    Check all possible CPT codes: 10272 - PT Re-evaluation, 97110- Therapeutic Exercise, [phone]- Neuro Re-education, [phone] - Gait Training, [phone] - Manual Therapy, 97530 - Therapeutic Activities, [phone] - Self Care, [phone] - Electrical stimulation (unattended), [id number] - Iontophoresis, [id number] - Ultrasound, and [id number] - Aquatic therapy    Check all conditions that are expected to impact treatment: None of these apply   If treatment provided at initial evaluation, no treatment charged due to lack of authorization.      Lorayne Bender PT DPT  11/26/2022, 10:58 AM     Check all possible CPT codes: 95188 -  PT Re-evaluation, 97110- Therapeutic Exercise, 97140 - Manual Therapy, 97530 - Therapeutic Activities, 97535 - Self Care, [phone] - Electrical stimulation (unattended), and 97035 - Ultrasound    Check all conditions that are expected to impact treatment: None of these apply   If treatment provided at initial evaluation, no treatment charged due to lack of authorization.

## 2023-01-13 ENCOUNTER — Encounter (HOSPITAL_BASED_OUTPATIENT_CLINIC_OR_DEPARTMENT_OTHER): Payer: Self-pay | Admitting: Physical Therapy

## 2023-01-31 ENCOUNTER — Ambulatory Visit (HOSPITAL_BASED_OUTPATIENT_CLINIC_OR_DEPARTMENT_OTHER): Payer: Medicaid Other | Admitting: Physical Therapy

## 2023-02-25 ENCOUNTER — Other Ambulatory Visit (HOSPITAL_BASED_OUTPATIENT_CLINIC_OR_DEPARTMENT_OTHER): Payer: Self-pay

## 2023-02-25 ENCOUNTER — Ambulatory Visit (INDEPENDENT_AMBULATORY_CARE_PROVIDER_SITE_OTHER): Payer: Medicaid Other | Admitting: Student

## 2023-02-25 ENCOUNTER — Ambulatory Visit (INDEPENDENT_AMBULATORY_CARE_PROVIDER_SITE_OTHER): Payer: Medicaid Other

## 2023-02-25 DIAGNOSIS — M25512 Pain in left shoulder: Secondary | ICD-10-CM | POA: Diagnosis not present

## 2023-02-25 DIAGNOSIS — S43015A Anterior dislocation of left humerus, initial encounter: Secondary | ICD-10-CM

## 2023-02-25 MED ORDER — MELOXICAM 15 MG PO TABS
15.0000 mg | ORAL_TABLET | Freq: Every day | ORAL | 0 refills | Status: AC
  Filled 2023-02-25: qty 10, 10d supply, fill #0

## 2023-02-25 NOTE — Progress Notes (Signed)
Chief Complaint: Left shoulder pain     History of Present Illness:    Eric Tapia is a 22 y.o. male with history of labral pair of the left shoulder 6 months ago presenting with return of left shoulder pain.  He states that about 2 weeks ago he was performing push-ups when he felt a pop in his shoulder followed by significant pain.  Then last week, he was at work when a drill fell off a shelf and hit him directly in the shoulder.  He reports decreased range of motion as well as difficulty sleeping and laying down on his left side.  Pain is moderate in intensity and is located in the anterior and superior aspect of the shoulder.  Is taking Tylenol for pain however this has not been helping much.  He is left-hand dominant.   Surgical History:   Left shoulder arthroscopy with labral pair -09/02/2022  PMH/PSH/Family History/Social History/Meds/Allergies:    Past Medical History:  Diagnosis Date   Attention deficit hyperactivity disorder (ADHD)    Epistaxis    PONV (postoperative nausea and vomiting)    Past Surgical History:  Procedure Laterality Date   NOSE SURGERY     SHOULDER ARTHROSCOPY WITH LABRAL REPAIR Left 09/02/2022   Procedure: LEFT SHOULDER ARTHROSCOPY WITH LABRAL REPAIR;  Surgeon: Huel Cote, MD;  Location: [facility] OR;  Service: Orthopedics;  Laterality: Left;   Social History   Socioeconomic History   Marital status: Single    Spouse name: Not on file   Number of children: Not on file   Years of education: Not on file   Highest education level: Not on file  Occupational History   Not on file  Tobacco Use   Smoking status: Never    Passive exposure: Yes   Smokeless tobacco: Never  Vaping Use   Vaping status: Not on file  Substance and Sexual Activity   Alcohol use: No   Drug use: No   Sexual activity: Not Currently  Other Topics Concern   Not on file  Social History Narrative   Not on file   Social Determinants of  Health   Financial Resource Strain: Not on file  Food Insecurity: Not on file  Transportation Needs: Not on file  Physical Activity: Not on file  Stress: Not on file  Social Connections: Not on file   No family history on file. No Known Allergies Current Outpatient Medications  Medication Sig Dispense Refill   meloxicam (MOBIC) 15 MG tablet Take 1 tablet (15 mg total) by mouth daily for 10 days. 10 tablet 0   acetaminophen (TYLENOL) 500 MG tablet Take 1,000 mg by mouth every 6 (six) hours as needed for moderate pain.     aspirin EC 325 MG tablet Take 1 tablet (325 mg total) by mouth daily. 30 tablet 0   ibuprofen (ADVIL) 200 MG tablet Take 400 mg by mouth every 6 (six) hours as needed for moderate pain.     Melatonin 10 MG TABS Take 10 mg by mouth at bedtime as needed (sleep).     Menthol, Topical Analgesic, (MINERAL ICE EX) Apply 1 Application topically daily as needed (pain).     oxyCODONE (OXY IR/ROXICODONE) 5 MG immediate release tablet Take 1 tablet (5 mg total) by mouth every 4 (four) hours as needed (severe pain).  10 tablet 0   No current facility-administered medications for this visit.   No results found.  Review of Systems:   A ROS was performed including pertinent positives and negatives as documented in the HPI.  Physical Exam :   Constitutional: NAD and appears stated age Neurological: Alert and oriented Psych: Appropriate affect and cooperative There were no vitals taken for this visit.   Comprehensive Musculoskeletal Exam:    Tenderness palpation over the anterior and superior aspects of the glenohumeral joint.  Active range of motion to 90 degrees forward flexion, 30 degrees external rotation, and internal rotation to L1.  Passive forward flexion to 110 degrees limited by pain.  No notable strength deficits with rotator cuff testing.  Positive O'Brien.  Imaging:   Xray (left shoulder 3 views): Negative    I personally reviewed and interpreted the  radiographs.   Assessment:   22 y.o. male with recurrence of left shoulder pain 6 months status post shoulder labral repair.  X-rays are negative for any fracture or bony abnormality after his recent direct trauma to the shoulder.  We did discuss that based on his symptoms and exam, there would be concern for injury to his previous labral repair.  I did discuss this with Dr. Steward Drone and he would like to have him return to clinic soon for evaluation and will consider further workup or treatment options at that time.  In the meantime I will start him on some meloxicam which he can take in addition to Tylenol for pain control.  Plan :    -Return to clinic on 7/25 with Dr. Steward Drone for further assessment     I personally saw and evaluated the patient, and participated in the management and treatment plan.  Hazle Nordmann, PA-C Orthopedics  This document was dictated using Conservation officer, historic buildings. A reasonable attempt at proof reading has been made to minimize errors.

## 2023-02-26 ENCOUNTER — Emergency Department (HOSPITAL_BASED_OUTPATIENT_CLINIC_OR_DEPARTMENT_OTHER): Payer: Medicaid Other

## 2023-02-26 ENCOUNTER — Emergency Department (HOSPITAL_BASED_OUTPATIENT_CLINIC_OR_DEPARTMENT_OTHER)
Admission: EM | Admit: 2023-02-26 | Discharge: 2023-02-26 | Disposition: A | Discharge status: Home / Self Care | Payer: Medicaid Other | Attending: Emergency Medicine | Admitting: Emergency Medicine

## 2023-02-26 ENCOUNTER — Other Ambulatory Visit: Payer: Self-pay

## 2023-02-26 DIAGNOSIS — W2105XA Struck by basketball, initial encounter: Secondary | ICD-10-CM | POA: Diagnosis not present

## 2023-02-26 DIAGNOSIS — M25532 Pain in left wrist: Secondary | ICD-10-CM | POA: Diagnosis not present

## 2023-02-26 DIAGNOSIS — S52502A Unspecified fracture of the lower end of left radius, initial encounter for closed fracture: Secondary | ICD-10-CM | POA: Diagnosis not present

## 2023-02-26 DIAGNOSIS — Y9367 Activity, basketball: Secondary | ICD-10-CM | POA: Insufficient documentation

## 2023-02-26 DIAGNOSIS — S52602A Unspecified fracture of lower end of left ulna, initial encounter for closed fracture: Secondary | ICD-10-CM | POA: Diagnosis not present

## 2023-02-26 DIAGNOSIS — S62102A Fracture of unspecified carpal bone, left wrist, initial encounter for closed fracture: Secondary | ICD-10-CM

## 2023-02-26 DIAGNOSIS — M7989 Other specified soft tissue disorders: Secondary | ICD-10-CM | POA: Diagnosis not present

## 2023-02-26 MED ORDER — LIDOCAINE HCL (PF) 1 % IJ SOLN: 10.0000 mL | Freq: Once | INTRAMUSCULAR | Status: DC

## 2023-02-26 MED ORDER — IBUPROFEN 800 MG PO TABS
800.0000 mg | ORAL_TABLET | Freq: Once | ORAL | Status: AC
  Administered 2023-02-26: 800 mg via ORAL
  Filled 2023-02-26: qty 1

## 2023-02-26 MED ORDER — IBUPROFEN 600 MG PO TABS: 600.0000 mg | ORAL_TABLET | Freq: Four times a day (QID) | ORAL | 0 refills | Status: DC | PRN

## 2023-02-26 MED ORDER — FENTANYL CITRATE PF 50 MCG/ML IJ SOSY: 25.0000 ug | PREFILLED_SYRINGE | Freq: Once | INTRAMUSCULAR | Status: DC

## 2023-02-26 NOTE — Discharge Instructions (Addendum)
As discussed, it does appear that you have broken your left wrist although everything is in proper alignment.  We put you in a splint while emergency department.  Wear splint to follow-up with hand specialist in the outpatient setting.  Recommend icing your wrist as well as taking medication in the form of Motrin.  Recommend following up with hand specialist in the outpatient setting for reevaluation.  I have sent in referral as well as attached information to call the office to set up an appointment.  Please do not hesitate to return to emergency department for worrisome signs and symptoms we discussed become apparent.

## 2023-02-26 NOTE — ED Triage Notes (Addendum)
Playing basketball. Dunked and held on to rim. Deformity to left wrist, painful with ROM in fingers. No pain in elbow. Pulses intact, cap refill <2 seconds. Sling applied for comfort/guarding.

## 2023-02-26 NOTE — ED Provider Notes (Signed)
[place] EMERGENCY DEPARTMENT AT [facility] Provider Note   CSN: [id number] Arrival date & time: 02/26/23  1633     History  Chief Complaint  Patient presents with   Wrist Pain    Left    Eric Tapia is a 22 y.o. male.   Wrist Pain   22 year old male presents emergency department with complaints of left wrist pain.  Patient states that he was hanging onto a basketball rim when he fell landing on the back part of his left wrist with his wrist flexed.  Reports pain subsequently in his left wrist and hand.  Denies trauma elsewhere.  No trauma to head, loss consciousness, blood thinner use.  Denies any chest pain, shortness of breath, abdominal pain, nausea, vomiting, lower extremity pain.  Patient has been able to ambulate without difficulty since then.  Does report some tingling type sensation in his digits of his left hand.  Past medical history significant for ADHD, epistaxis  Home Medications Prior to Admission medications   Medication Sig Start Date End Date Taking? Authorizing Provider  ibuprofen (ADVIL) 600 MG tablet Take 1 tablet (600 mg total) by mouth every 6 (six) hours as needed. 02/26/23  Yes Sherian Maroon A, PA  acetaminophen (TYLENOL) 500 MG tablet Take 1,000 mg by mouth every 6 (six) hours as needed for moderate pain.    [provider]  aspirin EC 325 MG tablet Take 1 tablet (325 mg total) by mouth daily. 08/15/22   Huel Cote, MD  Melatonin 10 MG TABS Take 10 mg by mouth at bedtime as needed (sleep).    [provider]  meloxicam (MOBIC) 15 MG tablet Take 1 tablet (15 mg total) by mouth daily for 10 days. 02/25/23 03/07/23  Amador Cunas, PA-C  Menthol, Topical Analgesic, (MINERAL ICE EX) Apply 1 Application topically daily as needed (pain).    [provider]  oxyCODONE (OXY IR/ROXICODONE) 5 MG immediate release tablet Take 1 tablet (5 mg total) by mouth every 4 (four) hours as needed (severe pain). 08/15/22   Huel Cote, MD      Allergies    Patient has no known allergies.    Review of Systems   Review of Systems  All other systems reviewed and are negative.   Physical Exam Updated Vital Signs BP 119/73 (BP Location: Right Arm)   Pulse 93   Temp 98.4 F (36.9 C)   Resp 18   SpO2 100%  Physical Exam Vitals and nursing note reviewed.  Constitutional:      General: He is not in acute distress.    Appearance: He is well-developed.  HENT:     Head: Normocephalic and atraumatic.  Eyes:     Conjunctiva/sclera: Conjunctivae normal.  Cardiovascular:     Rate and Rhythm: Normal rate and regular rhythm.     Heart sounds: No murmur heard. Pulmonary:     Effort: Pulmonary effort is normal. No respiratory distress.     Breath sounds: Normal breath sounds.  Abdominal:     Palpations: Abdomen is soft.     Tenderness: There is no abdominal tenderness.  Musculoskeletal:        General: No swelling.     Cervical back: Neck supple.     Comments: Diffuse tenderness to palpation of left wrist as well as left hand.  Patient with anatomical snuffbox tenderness as well as pain with axial loading of the thumb.  Limited range of motion of left wrist secondary to pain.  Radial pulses 2+ bilaterally.  Cap refill less than 2 seconds.  Patient able to move digits of left hand but with pain to left wrist.  Patient reports decreased sensation to fingertips but otherwise, with normal sensation of finger and hand.  Skin:    General: Skin is warm and dry.     Capillary Refill: Capillary refill takes less than 2 seconds.  Neurological:     Mental Status: He is alert.  Psychiatric:        Mood and Affect: Mood normal.     ED Results / Procedures / Treatments   Labs (all labs ordered are listed, but only abnormal results are displayed) Labs Reviewed - No data to display  EKG None  Radiology CT Wrist Left Wo Contrast  Result Date: 02/26/2023 CLINICAL DATA:  Left wrist pain and deformity after injury  playing basketball. EXAM: CT OF THE LEFT WRIST WITHOUT CONTRAST TECHNIQUE: Multidetector CT imaging was performed according to the standard protocol. Multiplanar CT image reconstructions were also generated. RADIATION DOSE REDUCTION: This exam was performed according to the departmental dose-optimization program which includes automated exposure control, adjustment of the mA and/or kV according to patient size and/or use of iterative reconstruction technique. COMPARISON:  Radiographs same date. FINDINGS: Bones/Joint/Cartilage Suspicion of nondisplaced fracture involving the dorsal aspect of the distal radius, ulnar to Lister's tubercle and best seen on the axial and sagittal images. No evidence of carpal bone fracture, dislocation or osteonecrosis. The carpal bones are normally aligned. There is a moderate-sized radiocarpal joint effusion without evidence of lipohemarthrosis. However, there does appear to be lipohemarthrosis within the tendon sheaths of the 2nd and 3rd extensor compartments. Ligaments Suboptimally assessed by CT. Muscles and Tendons As above, apparent lipohemarthrosis involving the extensor tendon sheaths of the 2nd and 3rd compartments. No tendon disruption or focal muscular abnormality identified by CT. Soft tissues Soft tissue swelling around the wrist without evidence of foreign body or soft tissue emphysema. IMPRESSION: 1. Suspicion of nondisplaced fracture involving the dorsal aspect of the distal radius, ulnar to Lister's tubercle. 2. No evidence of carpal bone fracture, dislocation or osteonecrosis. 3. Moderate-sized radiocarpal joint effusion with lipohemarthrosis involving the extensor tendon sheaths of the 2nd and 3rd compartments. The tendons appear grossly intact as evaluated by CT. If concern of tendon injury, MRI may be helpful for further evaluation. Electronically Signed   By: Carey Bullocks M.D.   On: 02/26/2023 19:03   DG Shoulder Left  Result Date: 02/26/2023 CLINICAL DATA:   Left shoulder pain after basketball game. EXAM: LEFT SHOULDER - 2+ VIEW COMPARISON:  None Available. FINDINGS: There is no evidence of fracture or dislocation. There is no evidence of arthropathy or other focal bone abnormality. Soft tissues are unremarkable. IMPRESSION: Negative. Electronically Signed   By: Larose Hires D.O.   On: 02/26/2023 17:50   DG Wrist Complete Left  Result Date: 02/26/2023 CLINICAL DATA:  Basketball injury.  Deformity of the left wrist. EXAM: LEFT WRIST - COMPLETE 3+ VIEW COMPARISON:  None Available. FINDINGS: There is no evidence of fracture or dislocation. There is no evidence of arthropathy or other focal bone abnormality. Soft tissues swelling about the dorsum of the wrist. IMPRESSION: Soft tissue swelling about the dorsum of the wrist. No acute fracture or dislocation. Electronically Signed   By: Larose Hires D.O.   On: 02/26/2023 17:50    Procedures Procedures    Medications Ordered in ED Medications  ibuprofen (ADVIL) tablet 800 mg (800 mg Oral Given 02/26/23 1818)  ED Course/ Medical Decision Making/ A&P Clinical Course as of 02/27/23 0981  Wed Feb 26, 2023  1815 DG Wrist Complete Left Reevaluation of the patient with sling removal showed patient still with limited range of motion of wrist with diffuse tenderness.  I discussed lack of x-ray findings concerning for fracture and patient and family member requesting additional imaging.  Will obtain CT study for potential underlying acute osseous abnormality. [CR]    Clinical Course User Index [CR] Peter Garter, PA                             Medical Decision Making Amount and/or Complexity of Data Reviewed Radiology: ordered. Decision-making details documented in ED Course.  Risk Prescription drug management.   This patient presents to the ED for concern of wrist/hand pain, this involves an extensive number of treatment options, and is a complaint that carries with it a high risk of complications  and morbidity.  The differential diagnosis includes fracture, strain/pain, dislocation, ligamentous/tendinous injury, neurovascular compromise, gout   Co morbidities that complicate the patient evaluation  See HPI   Additional history obtained:  Additional history obtained from EMR External records from outside source obtained and reviewed including hospital records   Lab Tests:  N/a   Imaging Studies ordered:  I ordered imaging studies including left wrist x-ray, CT left wrist I independently visualized and interpreted imaging which showed  Left wrist x-ray: No acute abnormality CT left wrist: Suspicion of nondisplaced fracture involving dorsal aspect of distal radius, ulnar to Lister's tubercle.  No evidence of carpal bone fracture, dislocation or osteonecrosis.  Moderate size radiocarpal joint effusion with likely hemarthrosis involving extensor tendon sheaths of second and third compartments. I agree with the radiologist interpretation  Cardiac Monitoring: / EKG:  The patient was maintained on a cardiac monitor.  I personally viewed and interpreted the cardiac monitored which showed an underlying rhythm of: Sinus rhythm   Consultations Obtained:  N/a   Problem List / ED Course / Critical interventions / Medication management  Left wrist pain I ordered medication including Motrin   Reevaluation of the patient after these medicines showed that the patient improved I have reviewed the patients home medicines and have made adjustments as needed   Social Determinants of Health:  Denies tobacco, illicit drug use   Test / Admission - Considered:  Left wrist pain. Vitals signs within normal range and stable throughout visit. Imaging studies significant for: See above 22 year old male presents emergency department complaints of left wrist pain after landing on flexed left wrist.  Patient's workup today overall consistent with fracture as indicated above.  Patient  with some decrease sensation of fingertips but able to flex and extend wrist as well as flex and extend digits with pain on the dorsal aspect of left wrist and without evidence of vascular compromise.  Patient has close follow-up with orthopedics tomorrow morning for left-sided shoulder pain.  Placed in splint as well as sling while in the ED will recommend rest, ice, elevation, NSAIDs.  Strict return precautions were discussed at length with patient if intractable pain, worsening sensory deficits, pale/cool and, fever.  Otherwise, follow-up with orthopedics tomorrow morning recommended.  Treatment plan discussed at length with patient and he acknowledged understanding was agreeable to said plan.  Patient overall well-appearing, afebrile in no acute distress. Worrisome signs and symptoms were discussed with the patient, and the patient acknowledged understanding to return to the ED  if noticed. Patient was stable upon discharge.          Final Clinical Impression(s) / ED Diagnoses Final diagnoses:  Closed fracture of left wrist, initial encounter    Rx / DC Orders ED Discharge Orders          Ordered    Ambulatory referral to Hand Surgery       Comments: Dr. Bradly Bienenstock - [facility]   02/26/23 1912    ibuprofen (ADVIL) 600 MG tablet  Every 6 hours PRN        02/26/23 1912              Peter Garter, PA 02/27/23 0807    Sloan Leiter, DO 02/28/23 1516

## 2023-02-27 ENCOUNTER — Ambulatory Visit (INDEPENDENT_AMBULATORY_CARE_PROVIDER_SITE_OTHER): Payer: Medicaid Other | Admitting: Orthopaedic Surgery

## 2023-02-27 DIAGNOSIS — S52592A Other fractures of lower end of left radius, initial encounter for closed fracture: Secondary | ICD-10-CM

## 2023-02-27 NOTE — Progress Notes (Signed)
Post Operative Evaluation    Procedure/Date of Surgery: Left shoulder arthroscopic labral repair 1/29  Interval History:    Presents today 63-month status left shoulder labral repair.  He was recently doing a push-up and did feel a pop in the left shoulder although this is dramatically better since this time.  He unfortunately did have a recent injury for which she presented to the emergency room was found to have a fracture after hanging on a basketball rim.  He was placed in a splint.  He is following up today for further discussion.  PMH/PSH/Family History/Social History/Meds/Allergies:    Past Medical History:  Diagnosis Date   Attention deficit hyperactivity disorder (ADHD)    Epistaxis    PONV (postoperative nausea and vomiting)    Past Surgical History:  Procedure Laterality Date   NOSE SURGERY     SHOULDER ARTHROSCOPY WITH LABRAL REPAIR Left 09/02/2022   Procedure: LEFT SHOULDER ARTHROSCOPY WITH LABRAL REPAIR;  Surgeon: Huel Cote, MD;  Location: [facility] OR;  Service: Orthopedics;  Laterality: Left;   Social History   Socioeconomic History   Marital status: Single    Spouse name: Not on file   Number of children: Not on file   Years of education: Not on file   Highest education level: Not on file  Occupational History   Not on file  Tobacco Use   Smoking status: Never    Passive exposure: Yes   Smokeless tobacco: Never  Vaping Use   Vaping status: Not on file  Substance and Sexual Activity   Alcohol use: No   Drug use: No   Sexual activity: Not Currently  Other Topics Concern   Not on file  Social History Narrative   Not on file   Social Determinants of Health   Financial Resource Strain: Not on file  Food Insecurity: Not on file  Transportation Needs: Not on file  Physical Activity: Not on file  Stress: Not on file  Social Connections: Not on file   No family history on file. No Known Allergies Current Outpatient  Medications  Medication Sig Dispense Refill   acetaminophen (TYLENOL) 500 MG tablet Take 1,000 mg by mouth every 6 (six) hours as needed for moderate pain.     aspirin EC 325 MG tablet Take 1 tablet (325 mg total) by mouth daily. 30 tablet 0   ibuprofen (ADVIL) 600 MG tablet Take 1 tablet (600 mg total) by mouth every 6 (six) hours as needed. 30 tablet 0   Melatonin 10 MG TABS Take 10 mg by mouth at bedtime as needed (sleep).     meloxicam (MOBIC) 15 MG tablet Take 1 tablet (15 mg total) by mouth daily for 10 days. 10 tablet 0   Menthol, Topical Analgesic, (MINERAL ICE EX) Apply 1 Application topically daily as needed (pain).     oxyCODONE (OXY IR/ROXICODONE) 5 MG immediate release tablet Take 1 tablet (5 mg total) by mouth every 4 (four) hours as needed (severe pain). 10 tablet 0   No current facility-administered medications for this visit.   CT Wrist Left Wo Contrast  Result Date: 02/26/2023 CLINICAL DATA:  Left wrist pain and deformity after injury playing basketball. EXAM: CT OF THE LEFT WRIST WITHOUT CONTRAST TECHNIQUE: Multidetector CT imaging was performed according to the standard protocol. Multiplanar CT image reconstructions  were also generated. RADIATION DOSE REDUCTION: This exam was performed according to the departmental dose-optimization program which includes automated exposure control, adjustment of the mA and/or kV according to patient size and/or use of iterative reconstruction technique. COMPARISON:  Radiographs same date. FINDINGS: Bones/Joint/Cartilage Suspicion of nondisplaced fracture involving the dorsal aspect of the distal radius, ulnar to Lister's tubercle and best seen on the axial and sagittal images. No evidence of carpal bone fracture, dislocation or osteonecrosis. The carpal bones are normally aligned. There is a moderate-sized radiocarpal joint effusion without evidence of lipohemarthrosis. However, there does appear to be lipohemarthrosis within the tendon sheaths of  the 2nd and 3rd extensor compartments. Ligaments Suboptimally assessed by CT. Muscles and Tendons As above, apparent lipohemarthrosis involving the extensor tendon sheaths of the 2nd and 3rd compartments. No tendon disruption or focal muscular abnormality identified by CT. Soft tissues Soft tissue swelling around the wrist without evidence of foreign body or soft tissue emphysema. IMPRESSION: 1. Suspicion of nondisplaced fracture involving the dorsal aspect of the distal radius, ulnar to Lister's tubercle. 2. No evidence of carpal bone fracture, dislocation or osteonecrosis. 3. Moderate-sized radiocarpal joint effusion with lipohemarthrosis involving the extensor tendon sheaths of the 2nd and 3rd compartments. The tendons appear grossly intact as evaluated by CT. If concern of tendon injury, MRI may be helpful for further evaluation. Electronically Signed   By: Carey Bullocks M.D.   On: 02/26/2023 19:03   DG Shoulder Left  Result Date: 02/26/2023 CLINICAL DATA:  Left shoulder pain after basketball game. EXAM: LEFT SHOULDER - 2+ VIEW COMPARISON:  None Available. FINDINGS: There is no evidence of fracture or dislocation. There is no evidence of arthropathy or other focal bone abnormality. Soft tissues are unremarkable. IMPRESSION: Negative. Electronically Signed   By: Larose Hires D.O.   On: 02/26/2023 17:50   DG Wrist Complete Left  Result Date: 02/26/2023 CLINICAL DATA:  Basketball injury.  Deformity of the left wrist. EXAM: LEFT WRIST - COMPLETE 3+ VIEW COMPARISON:  None Available. FINDINGS: There is no evidence of fracture or dislocation. There is no evidence of arthropathy or other focal bone abnormality. Soft tissues swelling about the dorsum of the wrist. IMPRESSION: Soft tissue swelling about the dorsum of the wrist. No acute fracture or dislocation. Electronically Signed   By: Larose Hires D.O.   On: 02/26/2023 17:50    Review of Systems:   A ROS was performed including pertinent positives and  negatives as documented in the HPI.   Musculoskeletal Exam:    Left shoulder incisions are well-appearing without erythema or drainage.  Range of motion is sitting position is forward elevation of 170 degrees equal to the contralateral side with external rotation at side to 60 degrees again except contralateral side.  Internal rotation is to T12 bilaterally without pain.  Negative apprehension.  1+ anterior posterior load shift, negative O'Brien . 2+ radial pulse  Left wrist with swelling and tenderness particular about the ulnar styloid.  No tenderness about the scaphoid.  There is limited range of motion about the wrist.  2+ radial pulse  Imaging:    X-rays 3 views left wrist, CT scan left wrist: There is a minimally displaced ulnar styloid fracture as well as a cortical fracture of the dorsal aspect of the distal radius  I personally reviewed and interpreted the radiographs.   Assessment:   72-month status post left shoulder arthroscopic labral repair doing well.  At this time she does appear to also have a ulnar styloid  as well as a distal radial nondisplaced fracture.  At this time he may get transitioned into a removable wrist splint.  He will progress his range of motion as tolerated this is a stable fracture.  I will plan to see him back in 3 weeks for reassessment at that time we will plan to discontinue the brace  Plan :    -Return to clinic in 3 weeks      I personally saw and evaluated the patient, and participated in the management and treatment plan.  Huel Cote, MD Attending Physician, Orthopedic Surgery  This document was dictated using Dragon voice recognition software. A reasonable attempt at proof reading has been made to minimize errors.

## 2023-03-05 ENCOUNTER — Ambulatory Visit (HOSPITAL_BASED_OUTPATIENT_CLINIC_OR_DEPARTMENT_OTHER): Payer: Medicaid Other | Admitting: Orthopaedic Surgery

## 2023-03-20 ENCOUNTER — Ambulatory Visit (HOSPITAL_BASED_OUTPATIENT_CLINIC_OR_DEPARTMENT_OTHER): Payer: Medicaid Other | Admitting: Orthopaedic Surgery

## 2023-04-11 ENCOUNTER — Encounter (HOSPITAL_BASED_OUTPATIENT_CLINIC_OR_DEPARTMENT_OTHER): Payer: Self-pay | Admitting: Emergency Medicine

## 2023-04-11 ENCOUNTER — Encounter (HOSPITAL_BASED_OUTPATIENT_CLINIC_OR_DEPARTMENT_OTHER): Payer: Self-pay | Admitting: Orthopaedic Surgery

## 2023-04-11 ENCOUNTER — Emergency Department (HOSPITAL_BASED_OUTPATIENT_CLINIC_OR_DEPARTMENT_OTHER): Payer: Medicaid Other

## 2023-04-11 ENCOUNTER — Ambulatory Visit (HOSPITAL_BASED_OUTPATIENT_CLINIC_OR_DEPARTMENT_OTHER): Payer: Medicaid Other | Admitting: Orthopaedic Surgery

## 2023-04-11 ENCOUNTER — Ambulatory Visit (HOSPITAL_BASED_OUTPATIENT_CLINIC_OR_DEPARTMENT_OTHER): Payer: Medicaid Other

## 2023-04-11 ENCOUNTER — Emergency Department (HOSPITAL_BASED_OUTPATIENT_CLINIC_OR_DEPARTMENT_OTHER)
Admission: EM | Admit: 2023-04-11 | Discharge: 2023-04-11 | Disposition: A | Discharge status: Home / Self Care | Payer: Medicaid Other | Attending: Emergency Medicine | Admitting: Emergency Medicine

## 2023-04-11 DIAGNOSIS — W228XXA Striking against or struck by other objects, initial encounter: Secondary | ICD-10-CM | POA: Diagnosis not present

## 2023-04-11 DIAGNOSIS — S52592A Other fractures of lower end of left radius, initial encounter for closed fracture: Secondary | ICD-10-CM | POA: Diagnosis not present

## 2023-04-11 DIAGNOSIS — S60011A Contusion of right thumb without damage to nail, initial encounter: Secondary | ICD-10-CM | POA: Diagnosis not present

## 2023-04-11 DIAGNOSIS — Z7982 Long term (current) use of aspirin: Secondary | ICD-10-CM | POA: Insufficient documentation

## 2023-04-11 DIAGNOSIS — S6991XA Unspecified injury of right wrist, hand and finger(s), initial encounter: Secondary | ICD-10-CM | POA: Diagnosis present

## 2023-04-11 DIAGNOSIS — S52612D Displaced fracture of left ulna styloid process, subsequent encounter for closed fracture with routine healing: Secondary | ICD-10-CM | POA: Diagnosis not present

## 2023-04-11 DIAGNOSIS — M7989 Other specified soft tissue disorders: Secondary | ICD-10-CM | POA: Diagnosis not present

## 2023-04-11 MED ORDER — IBUPROFEN 600 MG PO TABS: 600.0000 mg | ORAL_TABLET | Freq: Four times a day (QID) | ORAL | 0 refills | Status: DC | PRN

## 2023-04-11 MED ORDER — OXYCODONE-ACETAMINOPHEN 5-325 MG PO TABS
1.0000 | ORAL_TABLET | Freq: Once | ORAL | Status: DC
  Filled 2023-04-11: qty 1

## 2023-04-11 NOTE — ED Triage Notes (Signed)
Hit hand on ceiling fan while stretching. Right thumb/ palm area swelling pain. Happened around 5:30pm Took ibuprofen at home PTA

## 2023-04-11 NOTE — ED Provider Notes (Signed)
[place] EMERGENCY DEPARTMENT AT [facility] Provider Note   CSN: [id number] Arrival date & time: 04/11/23  1746     History  Chief Complaint  Patient presents with   Hand Injury    Eric Tapia is a 22 y.o. male.  The history is provided by the patient. No language interpreter was used.  Hand Injury    22 year old male presenting to the ED for evaluation of head injury.  Patient states earlier in the day he was stretching his arm and he actually hit the ceiling fan while it was spinning.  Impact was to his right thumb area.  Incident happened about an hour ago.  He endorsed acute onset of sharp throbbing pain and having difficulty moving his thumb due to the pain.  He did try taking ibuprofen prior to arrival with some improvement.  Currently rates his pain as 7 out of 10 nonradiating without any numbness.  Home Medications Prior to Admission medications   Medication Sig Start Date End Date Taking? Authorizing Provider  acetaminophen (TYLENOL) 500 MG tablet Take 1,000 mg by mouth every 6 (six) hours as needed for moderate pain.    [provider]  aspirin EC 325 MG tablet Take 1 tablet (325 mg total) by mouth daily. 08/15/22   Huel Cote, MD  ibuprofen (ADVIL) 600 MG tablet Take 1 tablet (600 mg total) by mouth every 6 (six) hours as needed. 02/26/23   Peter Garter, PA  Melatonin 10 MG TABS Take 10 mg by mouth at bedtime as needed (sleep).    [provider]  Menthol, Topical Analgesic, (MINERAL ICE EX) Apply 1 Application topically daily as needed (pain).    [provider]  oxyCODONE (OXY IR/ROXICODONE) 5 MG immediate release tablet Take 1 tablet (5 mg total) by mouth every 4 (four) hours as needed (severe pain). 08/15/22   Huel Cote, MD      Allergies    Patient has no known allergies.    Review of Systems   Review of Systems  All other systems reviewed and are negative.   Physical Exam Updated Vital Signs BP 114/71  (BP Location: Left Arm)   Pulse 64   Temp 98.2 F (36.8 C)   Resp 14   SpO2 100%  Physical Exam Vitals and nursing note reviewed.  Constitutional:      General: He is not in acute distress.    Appearance: He is well-developed.  HENT:     Head: Atraumatic.  Eyes:     Conjunctiva/sclera: Conjunctivae normal.  Musculoskeletal:        General: Signs of injury (Right thumb: Small abrasion noted to the proximal phalanx on the dorsum with decreased finger extension and flexion but no obvious deformity noted.  No tenderness to the IP joint or the MCP joint) present.     Cervical back: Neck supple.  Skin:    Findings: No rash.  Neurological:     Mental Status: He is alert.     ED Results / Procedures / Treatments   Labs (all labs ordered are listed, but only abnormal results are displayed) Labs Reviewed - No data to display  EKG None  Radiology DG Wrist Complete Left  Result Date: 04/11/2023 CLINICAL DATA:  Dorsal distal radius fracture and ulnar styloid avulsion fracture EXAM: LEFT WRIST - COMPLETE 3+ VIEW COMPARISON:  02/26/2023 FINDINGS: Very slight increased sclerosis of the left distal radius metaphysis suggesting healing of a subtle distal radius fracture. Tiny ulnar styloid tip  fracture also faintly visualized. Normal alignment. Carpal bones intact. No joint abnormality. There is less soft tissue swelling on the lateral view. IMPRESSION: Healing left distal radius and ulnar styloid tip fractures. Electronically Signed   By: Judie Petit.  Shick M.D.   On: 04/11/2023 15:05    Procedures Procedures    Medications Ordered in ED Medications  oxyCODONE-acetaminophen (PERCOCET/ROXICET) 5-325 MG per tablet 1 tablet (1 tablet Oral Patient Refused/Not Given 04/11/23 1919)    ED Course/ Medical Decision Making/ A&P                                 Medical Decision Making Amount and/or Complexity of Data Reviewed Radiology: ordered.  Risk Prescription drug management.   BP 114/71 (BP  Location: Left Arm)   Pulse 64   Temp 98.2 F (36.8 C)   Resp 14   SpO2 100%   24:27 PM 22 year old male presenting to the ED for evaluation of head injury.  Patient states earlier in the day he was stretching his arm and he actually hit the ceiling fan while it was spinning.  Impact was to his right thumb area.  Incident happened about an hour ago.  He endorsed acute onset of sharp throbbing pain and having difficulty moving his thumb due to the pain.  He did try taking ibuprofen prior to arrival with some improvement.  Currently rates his pain as 7 out of 10 nonradiating without any numbness.  On exam patient's right nondominant thumb he does have a small abrasion noted to the dorsum of his proximal phalanx without any joint involvement.  He has decreased range of motion about the thumb but this is likely pain related as there is no obvious deformity.  X-ray of right hand obtained independently viewed interpreted by me and is negative agree with radiologist interpretation.  DDx: Contusion, strain, sprain, fracture, dislocation, tendon injury  Ace wrap provided for support.  Patient received opiate pain medication with improvement of symptoms.  Will discharge home to have patient follow-up closely with his orthopedist Dr. Steward Drone for outpatient evaluation.        Final Clinical Impression(s) / ED Diagnoses Final diagnoses:  Contusion of right thumb without damage to nail, initial encounter    Rx / DC Orders ED Discharge Orders          Ordered    ibuprofen (ADVIL) 600 MG tablet  Every 6 hours PRN        04/11/23 2016              Fayrene Helper, PA-C 04/11/23 2017    Virgina Norfolk, DO 04/11/23 2229

## 2023-04-11 NOTE — Progress Notes (Signed)
Post Operative Evaluation    Procedure/Date of Surgery: Left shoulder arthroscopic labral repair 1/29  Interval History:    Presents today for follow-up of the left shoulder and left wrist.  There is some popping in the left shoulder but this is painless.  His left wrist overall he does feel much better and he is able to do all activities of daily living.  PMH/PSH/Family History/Social History/Meds/Allergies:    Past Medical History:  Diagnosis Date   Attention deficit hyperactivity disorder (ADHD)    Epistaxis    PONV (postoperative nausea and vomiting)    Past Surgical History:  Procedure Laterality Date   NOSE SURGERY     SHOULDER ARTHROSCOPY WITH LABRAL REPAIR Left 09/02/2022   Procedure: LEFT SHOULDER ARTHROSCOPY WITH LABRAL REPAIR;  Surgeon: Huel Cote, MD;  Location: [facility] OR;  Service: Orthopedics;  Laterality: Left;   Social History   Socioeconomic History   Marital status: Single    Spouse name: Not on file   Number of children: Not on file   Years of education: Not on file   Highest education level: Not on file  Occupational History   Not on file  Tobacco Use   Smoking status: Never    Passive exposure: Yes   Smokeless tobacco: Never  Vaping Use   Vaping status: Not on file  Substance and Sexual Activity   Alcohol use: No   Drug use: No   Sexual activity: Not Currently  Other Topics Concern   Not on file  Social History Narrative   Not on file   Social Determinants of Health   Financial Resource Strain: Not on file  Food Insecurity: Not on file  Transportation Needs: Not on file  Physical Activity: Not on file  Stress: Not on file  Social Connections: Not on file   History reviewed. No pertinent family history. No Known Allergies Current Outpatient Medications  Medication Sig Dispense Refill   acetaminophen (TYLENOL) 500 MG tablet Take 1,000 mg by mouth every 6 (six) hours as needed for moderate pain.      aspirin EC 325 MG tablet Take 1 tablet (325 mg total) by mouth daily. 30 tablet 0   ibuprofen (ADVIL) 600 MG tablet Take 1 tablet (600 mg total) by mouth every 6 (six) hours as needed. 30 tablet 0   Melatonin 10 MG TABS Take 10 mg by mouth at bedtime as needed (sleep).     Menthol, Topical Analgesic, (MINERAL ICE EX) Apply 1 Application topically daily as needed (pain).     oxyCODONE (OXY IR/ROXICODONE) 5 MG immediate release tablet Take 1 tablet (5 mg total) by mouth every 4 (four) hours as needed (severe pain). 10 tablet 0   No current facility-administered medications for this visit.   No results found.  Review of Systems:   A ROS was performed including pertinent positives and negatives as documented in the HPI.   Musculoskeletal Exam:    Left shoulder incisions are well-appearing without erythema or drainage.  Range of motion is sitting position is forward elevation of 170 degrees equal to the contralateral side with external rotation at side to 60 degrees again except contralateral side.  Internal rotation is to T12 bilaterally without pain.  Negative apprehension.  1+ anterior posterior load shift, negative O'Brien . 2+ radial pulse  Left wrist  with swelling and tenderness particular about the ulnar styloid.  No tenderness about the scaphoid.  There is limited range of motion about the wrist.  2+ radial pulse  Imaging:    X-rays 3 views left wrist, CT scan left wrist: There is a minimally displaced ulnar styloid fracture as well as a cortical fracture of the dorsal aspect of the distal radius  I personally reviewed and interpreted the radiographs.   Assessment:   Status post left shoulder arthroscopic labral repair doing well.  Overall he has some healing about the ulnar styloid.  He has improved pain.  We will plan to see him back as needed  Plan :    -Return to clinic as needed      I personally saw and evaluated the patient, and participated in the management and  treatment plan.  Huel Cote, MD Attending Physician, Orthopedic Surgery  This document was dictated using Dragon voice recognition software. A reasonable attempt at proof reading has been made to minimize errors.

## 2023-04-16 ENCOUNTER — Ambulatory Visit (HOSPITAL_BASED_OUTPATIENT_CLINIC_OR_DEPARTMENT_OTHER): Payer: Medicaid Other | Admitting: Orthopaedic Surgery

## 2023-04-23 ENCOUNTER — Ambulatory Visit (HOSPITAL_BASED_OUTPATIENT_CLINIC_OR_DEPARTMENT_OTHER): Payer: Medicaid Other | Admitting: Orthopaedic Surgery

## 2023-04-23 ENCOUNTER — Ambulatory Visit (INDEPENDENT_AMBULATORY_CARE_PROVIDER_SITE_OTHER): Payer: Medicaid Other | Admitting: Orthopaedic Surgery

## 2023-04-23 DIAGNOSIS — S43015A Anterior dislocation of left humerus, initial encounter: Secondary | ICD-10-CM | POA: Diagnosis not present

## 2023-04-23 NOTE — Progress Notes (Signed)
Post Operative Evaluation    Procedure/Date of Surgery: Left shoulder arthroscopic labral repair 1/29  Interval History:    Presents today for follow-up of the left shoulder.  Presents today ultimately with an additional shoulder dislocation which he sustained while pitching 1 week prior.  He states that the shoulder has popped out and this time subsequently felt numb and painful.  PMH/PSH/Family History/Social History/Meds/Allergies:    Past Medical History:  Diagnosis Date   Attention deficit hyperactivity disorder (ADHD)    Epistaxis    PONV (postoperative nausea and vomiting)    Past Surgical History:  Procedure Laterality Date   NOSE SURGERY     SHOULDER ARTHROSCOPY WITH LABRAL REPAIR Left 09/02/2022   Procedure: LEFT SHOULDER ARTHROSCOPY WITH LABRAL REPAIR;  Surgeon: Huel Cote, MD;  Location: [facility] OR;  Service: Orthopedics;  Laterality: Left;   Social History   Socioeconomic History   Marital status: Single    Spouse name: Not on file   Number of children: Not on file   Years of education: Not on file   Highest education level: Not on file  Occupational History   Not on file  Tobacco Use   Smoking status: Never    Passive exposure: Yes   Smokeless tobacco: Never  Vaping Use   Vaping status: Not on file  Substance and Sexual Activity   Alcohol use: No   Drug use: No   Sexual activity: Not Currently  Other Topics Concern   Not on file  Social History Narrative   Not on file   Social Determinants of Health   Financial Resource Strain: Not on file  Food Insecurity: Not on file  Transportation Needs: Not on file  Physical Activity: Not on file  Stress: Not on file  Social Connections: Not on file   No family history on file. No Known Allergies Current Outpatient Medications  Medication Sig Dispense Refill   acetaminophen (TYLENOL) 500 MG tablet Take 1,000 mg by mouth every 6 (six) hours as needed for moderate pain.      aspirin EC 325 MG tablet Take 1 tablet (325 mg total) by mouth daily. 30 tablet 0   ibuprofen (ADVIL) 600 MG tablet Take 1 tablet (600 mg total) by mouth every 6 (six) hours as needed for moderate pain. 30 tablet 0   Melatonin 10 MG TABS Take 10 mg by mouth at bedtime as needed (sleep).     Menthol, Topical Analgesic, (MINERAL ICE EX) Apply 1 Application topically daily as needed (pain).     oxyCODONE (OXY IR/ROXICODONE) 5 MG immediate release tablet Take 1 tablet (5 mg total) by mouth every 4 (four) hours as needed (severe pain). 10 tablet 0   No current facility-administered medications for this visit.   No results found.  Review of Systems:   A ROS was performed including pertinent positives and negatives as documented in the HPI.   Musculoskeletal Exam:    Left shoulder incisions are well-appearing.  He has positive apprehension with abduction and external rotation of 90/90.  He has pain about the glenohumeral joint.  2+ anterior shift with 1+ posterior shift.  Remainder of distal neurosensory exam is intact  Left wrist with swelling and tenderness particular about the ulnar styloid.  No tenderness about the scaphoid.  There is limited range of motion about  the wrist.  2+ radial pulse  Imaging:    X-rays 3 views left wrist, CT scan left wrist: There is a minimally displaced ulnar styloid fracture as well as a cortical fracture of the dorsal aspect of the distal radius  I personally reviewed and interpreted the radiographs.   Assessment:   Status post left shoulder arthroscopic labral repair fortunately with a recurrence and subsequent shoulder dislocation.  Given the fact that he has had a recurrent dislocation after initial labral repair I would recommend an MRI at this time.  Will plan for this and I will plan to see him back following discuss results  Plan :    -Plan for MRI left shoulder and follow-up discuss results      I personally saw and evaluated the patient,  and participated in the management and treatment plan.  Huel Cote, MD Attending Physician, Orthopedic Surgery  This document was dictated using Dragon voice recognition software. A reasonable attempt at proof reading has been made to minimize errors.

## 2023-04-27 ENCOUNTER — Ambulatory Visit (HOSPITAL_COMMUNITY)
Admission: RE | Admit: 2023-04-27 | Discharge: 2023-04-27 | Disposition: A | Discharge status: Home / Self Care | Payer: Medicaid Other | Source: Ambulatory Visit | Attending: Orthopaedic Surgery | Admitting: Orthopaedic Surgery

## 2023-04-27 DIAGNOSIS — S43015A Anterior dislocation of left humerus, initial encounter: Secondary | ICD-10-CM | POA: Diagnosis not present

## 2023-04-27 DIAGNOSIS — G8929 Other chronic pain: Secondary | ICD-10-CM | POA: Diagnosis not present

## 2023-04-27 DIAGNOSIS — M25312 Other instability, left shoulder: Secondary | ICD-10-CM | POA: Diagnosis not present

## 2023-04-27 DIAGNOSIS — M25512 Pain in left shoulder: Secondary | ICD-10-CM | POA: Diagnosis not present

## 2023-05-02 ENCOUNTER — Ambulatory Visit (HOSPITAL_BASED_OUTPATIENT_CLINIC_OR_DEPARTMENT_OTHER): Payer: Self-pay | Admitting: Orthopaedic Surgery

## 2023-05-02 ENCOUNTER — Other Ambulatory Visit (HOSPITAL_BASED_OUTPATIENT_CLINIC_OR_DEPARTMENT_OTHER): Payer: Self-pay

## 2023-05-02 ENCOUNTER — Ambulatory Visit (HOSPITAL_BASED_OUTPATIENT_CLINIC_OR_DEPARTMENT_OTHER): Payer: Medicaid Other | Admitting: Orthopaedic Surgery

## 2023-05-02 DIAGNOSIS — S43015A Anterior dislocation of left humerus, initial encounter: Secondary | ICD-10-CM

## 2023-05-02 MED ORDER — ACETAMINOPHEN 500 MG PO TABS
500.0000 mg | ORAL_TABLET | Freq: Three times a day (TID) | ORAL | 0 refills | Status: AC
  Filled 2023-05-02: qty 30, 10d supply, fill #0

## 2023-05-02 MED ORDER — ASPIRIN 325 MG PO TBEC
325.0000 mg | DELAYED_RELEASE_TABLET | Freq: Every day | ORAL | 0 refills | Status: DC
  Filled 2023-05-02: qty 14, 14d supply, fill #0

## 2023-05-02 MED ORDER — OXYCODONE HCL 5 MG PO TABS
5.0000 mg | ORAL_TABLET | ORAL | 0 refills | Status: DC | PRN
  Filled 2023-05-02: qty 15, 3d supply, fill #0

## 2023-05-02 MED ORDER — IBUPROFEN 800 MG PO TABS
800.0000 mg | ORAL_TABLET | Freq: Three times a day (TID) | ORAL | 0 refills | Status: AC
  Filled 2023-05-02: qty 30, 10d supply, fill #0

## 2023-05-02 NOTE — Progress Notes (Signed)
Post Operative Evaluation    Procedure/Date of Surgery: Left shoulder arthroscopic labral repair 1/29  Interval History:    Presents today for follow-up of his left shoulder MRI.  He is still having feelings of residual instability particularly in the back.  PMH/PSH/Family History/Social History/Meds/Allergies:    Past Medical History:  Diagnosis Date   Attention deficit hyperactivity disorder (ADHD)    Epistaxis    PONV (postoperative nausea and vomiting)    Past Surgical History:  Procedure Laterality Date   NOSE SURGERY     SHOULDER ARTHROSCOPY WITH LABRAL REPAIR Left 09/02/2022   Procedure: LEFT SHOULDER ARTHROSCOPY WITH LABRAL REPAIR;  Surgeon: Huel Cote, MD;  Location: [facility] OR;  Service: Orthopedics;  Laterality: Left;   Social History   Socioeconomic History   Marital status: Single    Spouse name: Not on file   Number of children: Not on file   Years of education: Not on file   Highest education level: Not on file  Occupational History   Not on file  Tobacco Use   Smoking status: Never    Passive exposure: Yes   Smokeless tobacco: Never  Vaping Use   Vaping status: Not on file  Substance and Sexual Activity   Alcohol use: No   Drug use: No   Sexual activity: Not Currently  Other Topics Concern   Not on file  Social History Narrative   Not on file   Social Determinants of Health   Financial Resource Strain: Not on file  Food Insecurity: Not on file  Transportation Needs: Not on file  Physical Activity: Not on file  Stress: Not on file  Social Connections: Not on file   No family history on file. No Known Allergies Current Outpatient Medications  Medication Sig Dispense Refill   acetaminophen (TYLENOL) 500 MG tablet Take 1 tablet (500 mg total) by mouth every 8 (eight) hours for 10 days. 30 tablet 0   aspirin EC 325 MG tablet Take 1 tablet (325 mg total) by mouth daily. 14 tablet 0   ibuprofen (ADVIL) 800 MG  tablet Take 1 tablet (800 mg total) by mouth every 8 (eight) hours for 10 days. Please take with food, please alternate with acetaminophen 30 tablet 0   oxyCODONE (ROXICODONE) 5 MG immediate release tablet Take 1 tablet (5 mg total) by mouth every 4 (four) hours as needed for severe pain or breakthrough pain. 15 tablet 0   acetaminophen (TYLENOL) 500 MG tablet Take 1,000 mg by mouth every 6 (six) hours as needed for moderate pain.     aspirin EC 325 MG tablet Take 1 tablet (325 mg total) by mouth daily. 30 tablet 0   ibuprofen (ADVIL) 600 MG tablet Take 1 tablet (600 mg total) by mouth every 6 (six) hours as needed for moderate pain. 30 tablet 0   Melatonin 10 MG TABS Take 10 mg by mouth at bedtime as needed (sleep).     Menthol, Topical Analgesic, (MINERAL ICE EX) Apply 1 Application topically daily as needed (pain).     oxyCODONE (OXY IR/ROXICODONE) 5 MG immediate release tablet Take 1 tablet (5 mg total) by mouth every 4 (four) hours as needed (severe pain). 10 tablet 0   No current facility-administered medications for this visit.   No results found.  Review of Systems:  A ROS was performed including pertinent positives and negatives as documented in the HPI.   Musculoskeletal Exam:    Left shoulder incisions are well-appearing.  He has positive apprehension with abduction and external rotation of 90/90.  He has pain about the glenohumeral joint.  2+ anterior shift with 2+ posterior shift.  Positive jerk maneuver.  Remainder of distal neurosensory exam is intact  Left wrist with swelling and tenderness particular about the ulnar styloid.  No tenderness about the scaphoid.  There is limited range of motion about the wrist.  2+ radial pulse  Imaging:    X-rays 3 views left wrist, CT scan left wrist: There is a minimally displaced ulnar styloid fracture as well as a cortical fracture of the dorsal aspect of the distal radius  MRI left shoulder: There is a posterior inferior labral  tear  I personally reviewed and interpreted the radiographs.   Assessment:   Status post left shoulder arthroscopic labral repair fortunately with a recurrence and subsequent shoulder dislocation.  Unfortunately does have a inferior labral tear both posteriorly and likely anteriorly.  Given the fact that he has not had a recurrence of instability we did discuss surgical intervention.  I do believe he would benefit from a left shoulder arthroscopy and revision labral repair.  Specifically I do not believe that he has a significant amount of bone loss to recommend any type of bony procedure.  He does appear to have good labral tissue that could be revised in terms of a repair.  All limitations and restrictions were discussed  Plan :    -Plan for left shoulder arthroscopy with labral repair   After a lengthy discussion of treatment options, including risks, benefits, alternatives, complications of surgical and nonsurgical conservative options, the patient elected surgical repair.   The patient  is aware of the material risks  and complications including, but not limited to injury to adjacent structures, neurovascular injury, infection, numbness, bleeding, implant failure, thermal burns, stiffness, persistent pain, failure to heal, disease transmission from allograft, need for further surgery, dislocation, anesthetic risks, blood clots, risks of death,and others. The probabilities of surgical success and failure discussed with patient given their particular co-morbidities.The time and nature of expected rehabilitation and recovery was discussed.The patient's questions were all answered preoperatively.  No barriers to understanding were noted. I explained the natural history of the disease process and Rx rationale.  I explained to the patient what I considered to be reasonable expectations given their personal situation.  The final treatment plan was arrived at through a shared patient decision making  process model.       I personally saw and evaluated the patient, and participated in the management and treatment plan.  Huel Cote, MD Attending Physician, Orthopedic Surgery  This document was dictated using Dragon voice recognition software. A reasonable attempt at proof reading has been made to minimize errors.

## 2023-05-14 ENCOUNTER — Other Ambulatory Visit (HOSPITAL_BASED_OUTPATIENT_CLINIC_OR_DEPARTMENT_OTHER): Payer: Self-pay | Admitting: Orthopaedic Surgery

## 2023-05-14 DIAGNOSIS — S43015A Anterior dislocation of left humerus, initial encounter: Secondary | ICD-10-CM

## 2023-06-07 ENCOUNTER — Emergency Department (HOSPITAL_BASED_OUTPATIENT_CLINIC_OR_DEPARTMENT_OTHER)
Admission: EM | Admit: 2023-06-07 | Discharge: 2023-06-07 | Disposition: A | Discharge status: Home / Self Care | Payer: Medicaid Other | Attending: Emergency Medicine | Admitting: Emergency Medicine

## 2023-06-07 ENCOUNTER — Other Ambulatory Visit: Payer: Self-pay

## 2023-06-07 ENCOUNTER — Encounter (HOSPITAL_BASED_OUTPATIENT_CLINIC_OR_DEPARTMENT_OTHER): Payer: Self-pay

## 2023-06-07 ENCOUNTER — Emergency Department (HOSPITAL_BASED_OUTPATIENT_CLINIC_OR_DEPARTMENT_OTHER): Payer: Medicaid Other

## 2023-06-07 DIAGNOSIS — Z7982 Long term (current) use of aspirin: Secondary | ICD-10-CM | POA: Insufficient documentation

## 2023-06-07 DIAGNOSIS — R079 Chest pain, unspecified: Secondary | ICD-10-CM | POA: Diagnosis not present

## 2023-06-07 DIAGNOSIS — R0789 Other chest pain: Secondary | ICD-10-CM | POA: Insufficient documentation

## 2023-06-07 NOTE — Discharge Instructions (Signed)
Your chest x-ray looks good.  I think is unlikely that this is a heart attack or blood clot in the lung.  Lets treat you like this is chest wall pain.  Please follow-up with your doctor in the office.  Take 4 over the counter ibuprofen tablets 3 times a day or 2 over-the-counter naproxen tablets twice a day for pain. Also take tylenol 1000mg (2 extra strength) four times a day.

## 2023-06-07 NOTE — ED Triage Notes (Signed)
Complaining of chest pain that started yesterday that came and went. Tonight it has been constant and would not let him go to sleep.

## 2023-06-07 NOTE — ED Provider Notes (Signed)
[place] EMERGENCY DEPARTMENT AT [facility] Provider Note   CSN: [id number] Arrival date & time: 06/07/23  0044     History  Chief Complaint  Patient presents with   Chest Pain    Eric Tapia is a 22 y.o. male.  22 yo M with a chief complaints of chest pain.  Going on since yesterday.  Seems to come and go.  Notices it more when he first wakes up.  He denies injury to the chest.  Denies cough congestion or fever.  Denies history of PE or DVT denies hemoptysis denies unilateral lower extremity edema denies recent surgery immobilization or hospitalization.  Denies history of cancer.  Denies testosterone use.  No early MI in his family.   Chest Pain      Home Medications Prior to Admission medications   Medication Sig Start Date End Date Taking? Authorizing Provider  acetaminophen (TYLENOL) 500 MG tablet Take 1,000 mg by mouth every 6 (six) hours as needed for moderate pain.    [provider]  aspirin EC 325 MG tablet Take 1 tablet (325 mg total) by mouth daily. 08/15/22   Huel Cote, MD  aspirin EC 325 MG tablet Take 1 tablet (325 mg total) by mouth daily. 05/02/23   Huel Cote, MD  ibuprofen (ADVIL) 600 MG tablet Take 1 tablet (600 mg total) by mouth every 6 (six) hours as needed for moderate pain. 04/11/23   Fayrene Helper, PA-C  Melatonin 10 MG TABS Take 10 mg by mouth at bedtime as needed (sleep).    [provider]  Menthol, Topical Analgesic, (MINERAL ICE EX) Apply 1 Application topically daily as needed (pain).    [provider]  oxyCODONE (OXY IR/ROXICODONE) 5 MG immediate release tablet Take 1 tablet (5 mg total) by mouth every 4 (four) hours as needed (severe pain). 08/15/22   Huel Cote, MD  oxyCODONE (ROXICODONE) 5 MG immediate release tablet Take 1 tablet (5 mg total) by mouth every 4 (four) hours as needed for severe pain or breakthrough pain. 05/02/23   Huel Cote, MD      Allergies    Patient has no known  allergies.    Review of Systems   Review of Systems  Cardiovascular:  Positive for chest pain.    Physical Exam Updated Vital Signs BP 127/77   Pulse (!) 51   Temp 98.2 F (36.8 C) (Oral)   Resp 18   Ht 6' (1.829 m)   Wt 66.2 kg   SpO2 100%   BMI 19.80 kg/m  Physical Exam Vitals and nursing note reviewed.  Constitutional:      Appearance: He is well-developed.  HENT:     Head: Normocephalic and atraumatic.  Eyes:     Pupils: Pupils are equal, round, and reactive to light.  Neck:     Vascular: No JVD.  Cardiovascular:     Rate and Rhythm: Normal rate and regular rhythm.     Heart sounds: No murmur heard.    No friction rub. No gallop.  Pulmonary:     Effort: No respiratory distress.     Breath sounds: No wheezing.  Chest:     Chest wall: Tenderness present.     Comments: Pain along the mid sternal border reproduces his discomfort Abdominal:     General: There is no distension.     Tenderness: There is no abdominal tenderness. There is no guarding or rebound.  Musculoskeletal:        General: Normal range  of motion.     Cervical back: Normal range of motion and neck supple.  Skin:    Coloration: Skin is not pale.     Findings: No rash.  Neurological:     Mental Status: He is alert and oriented to person, place, and time.  Psychiatric:        Behavior: Behavior normal.     ED Results / Procedures / Treatments   Labs (all labs ordered are listed, but only abnormal results are displayed) Labs Reviewed - No data to display  EKG EKG Interpretation Date/Time:  Saturday June 07 2023 00:51:35 EDT Ventricular Rate:  71 PR Interval:  222 QRS Duration:  100 QT Interval:  400 QTC Calculation: 435 R Axis:   179  Text Interpretation: Sinus rhythm Prolonged PR interval Right ventricular hypertrophy Baseline wander in lead(s) V3 likely ber No old tracing to compare Confirmed by Melene Plan [phone]) on 06/07/2023 1:04:00 AM  Radiology DG Chest Port 1  View  Result Date: 06/07/2023 CLINICAL DATA:  Chest pain EXAM: PORTABLE CHEST 1 VIEW COMPARISON:  None Available. FINDINGS: The heart size and mediastinal contours are within normal limits. Both lungs are clear. The visualized skeletal structures are unremarkable. IMPRESSION: No active disease. Electronically Signed   By: Helyn Numbers M.D.   On: 06/07/2023 01:37    Procedures Procedures    Medications Ordered in ED Medications - No data to display  ED Course/ Medical Decision Making/ A&P                                 Medical Decision Making Amount and/or Complexity of Data Reviewed Radiology: ordered.   22 yo M with a chief complaint of chest pain.  Going on for couple days.  Atypical in nature.  Has been able to exercise without eliciting worsening discomfort.  He has palpable discomfort.  Likely musculoskeletal by history and physical.  Plain film of the chest independently interpreted by me without focal infiltrate or pneumothorax.  EKG is not ischemic.  Clinically the patient is not PERC negative, he had a documented heart rate of 104 that was immediately documented afterwards to be 45.  His heart rates been in the 50s since he has been put in room.  I think the heart rate of 104 is likely entered in error.  His symptoms are also completely atypical of pulmonary embolism and his pain is reproduced on exam.  I will hold off on further workup.  2:03 AM:  I have discussed the diagnosis/risks/treatment options with the patient and family.  Evaluation and diagnostic testing in the emergency department does not suggest an emergent condition requiring admission or immediate intervention beyond what has been performed at this time.  They will follow up with PCP. We also discussed returning to the ED immediately if new or worsening sx occur. We discussed the sx which are most concerning (e.g., sudden worsening pain, fever, inability to tolerate by mouth) that necessitate immediate return.  Medications administered to the patient during their visit and any new prescriptions provided to the patient are listed below.  Medications given during this visit Medications - No data to display   The patient appears reasonably screen and/or stabilized for discharge and I doubt any other medical condition or other [facility] requiring further screening, evaluation, or treatment in the ED at this time prior to discharge.          Final Clinical  Impression(s) / ED Diagnoses Final diagnoses:  Nonspecific chest pain    Rx / DC Orders ED Discharge Orders     None         Melene Plan, DO 06/07/23 0203

## 2023-06-07 NOTE — ED Notes (Signed)
Reviewed AVS with patient, patient expressed understanding of directions, denies further questions at this time. 

## 2023-06-10 ENCOUNTER — Other Ambulatory Visit: Payer: Self-pay

## 2023-06-10 ENCOUNTER — Encounter (HOSPITAL_BASED_OUTPATIENT_CLINIC_OR_DEPARTMENT_OTHER): Payer: Self-pay | Admitting: Orthopaedic Surgery

## 2023-06-10 NOTE — Progress Notes (Signed)
   06/10/23 1125  PAT Phone Screen  Is the patient taking a GLP-1 receptor agonist? No  Do You Have Diabetes? No  Do You Have Hypertension? No  Have You Ever Been to the ER for Asthma? No  Have You Taken Oral Steroids in the Past 3 Months? No  Do you Take Phenteramine or any Other Diet Drugs? No  Recent  Lab Work, EKG, CXR? Yes  Where was this test performed? (S)  EKG 06/07/23 NSR seen in ED for non specific chest pain - discharged as musculoskeletal in nature.  Do you have a history of heart problems? No  Any Recent Hospitalizations? No  Height 6' (1.829 m)  Weight 66 kg  Pat Appointment Scheduled No  Reason for No Appointment Not Needed

## 2023-06-12 NOTE — Progress Notes (Signed)

## 2023-06-16 ENCOUNTER — Encounter (HOSPITAL_BASED_OUTPATIENT_CLINIC_OR_DEPARTMENT_OTHER): Payer: Self-pay | Admitting: Anesthesiology

## 2023-06-16 NOTE — Anesthesia Preprocedure Evaluation (Signed)
Anesthesia Evaluation    Reviewed: Allergy & Precautions, Patient's Chart, lab work & pertinent test results  History of Anesthesia Complications (+) PONV and history of anesthetic complications  Airway        Dental   Pulmonary neg pulmonary ROS          Cardiovascular negative cardio ROS      Neuro/Psych negative neurological ROS  negative psych ROS   GI/Hepatic negative GI ROS, Neg liver ROS,,,  Endo/Other  negative endocrine ROS    Renal/GU negative Renal ROS     Musculoskeletal negative musculoskeletal ROS (+)    Abdominal   Peds  (+) ADHD Hematology negative hematology ROS (+)   Anesthesia Other Findings   Reproductive/Obstetrics                             Anesthesia Physical Anesthesia Plan  ASA: 1  Anesthesia Plan: General   Post-op Pain Management: Regional block* and Tylenol PO (pre-op)*   Induction: Intravenous  PONV Risk Score and Plan: 3 and Treatment may vary due to age or medical condition, Ondansetron, Dexamethasone and Midazolam  Airway Management Planned: Oral ETT  Additional Equipment: None  Intra-op Plan:   Post-operative Plan: Extubation in OR  Informed Consent:   Plan Discussed with: CRNA and Anesthesiologist  Anesthesia Plan Comments:        Anesthesia Quick Evaluation

## 2023-06-17 ENCOUNTER — Ambulatory Visit (HOSPITAL_BASED_OUTPATIENT_CLINIC_OR_DEPARTMENT_OTHER)
Admission: RE | Admit: 2023-06-17 | Discharge: 2023-06-17 | Disposition: A | Discharge status: Home / Self Care | Payer: Medicaid Other | Attending: Orthopaedic Surgery | Admitting: Orthopaedic Surgery

## 2023-06-17 ENCOUNTER — Encounter (HOSPITAL_BASED_OUTPATIENT_CLINIC_OR_DEPARTMENT_OTHER): Payer: Self-pay | Admitting: Orthopaedic Surgery

## 2023-06-17 ENCOUNTER — Encounter (HOSPITAL_BASED_OUTPATIENT_CLINIC_OR_DEPARTMENT_OTHER)
Admission: RE | Disposition: A | Discharge status: Home / Self Care | Payer: Self-pay | Source: Home / Self Care | Attending: Orthopaedic Surgery

## 2023-06-17 ENCOUNTER — Other Ambulatory Visit: Payer: Self-pay

## 2023-06-17 DIAGNOSIS — S43015A Anterior dislocation of left humerus, initial encounter: Secondary | ICD-10-CM

## 2023-06-17 SURGERY — SHOULDER ARTHROSCOPY WITH LABRAL REPAIR
Anesthesia: General | Site: Shoulder | Laterality: Left

## 2023-06-17 MED ORDER — LACTATED RINGERS IV SOLN: INTRAVENOUS | Status: DC

## 2023-06-17 MED ORDER — PHENYLEPHRINE 80 MCG/ML (10ML) SYRINGE FOR IV PUSH (FOR BLOOD PRESSURE SUPPORT)
PREFILLED_SYRINGE | INTRAVENOUS | Status: AC
  Filled 2023-06-17: qty 10

## 2023-06-17 MED ORDER — MIDAZOLAM HCL 2 MG/2ML IJ SOLN
INTRAMUSCULAR | Status: AC
Start: 2023-06-17 — End: ?
  Filled 2023-06-17: qty 2

## 2023-06-17 MED ORDER — TRANEXAMIC ACID-NACL 1000-0.7 MG/100ML-% IV SOLN
INTRAVENOUS | Status: AC
  Filled 2023-06-17: qty 100

## 2023-06-17 MED ORDER — EPHEDRINE 5 MG/ML INJ
INTRAVENOUS | Status: AC
  Filled 2023-06-17: qty 5

## 2023-06-17 MED ORDER — DEXAMETHASONE SODIUM PHOSPHATE 10 MG/ML IJ SOLN
INTRAMUSCULAR | Status: AC
Start: 2023-06-17 — End: ?
  Filled 2023-06-17: qty 2

## 2023-06-17 MED ORDER — GABAPENTIN 300 MG PO CAPS: 300.0000 mg | ORAL_CAPSULE | Freq: Once | ORAL | Status: DC

## 2023-06-17 MED ORDER — LIDOCAINE 2% (20 MG/ML) 5 ML SYRINGE
INTRAMUSCULAR | Status: AC
  Filled 2023-06-17: qty 5

## 2023-06-17 MED ORDER — GABAPENTIN 300 MG PO CAPS
ORAL_CAPSULE | ORAL | Status: AC
  Filled 2023-06-17: qty 1

## 2023-06-17 MED ORDER — TRANEXAMIC ACID-NACL 1000-0.7 MG/100ML-% IV SOLN: 1000.0000 mg | INTRAVENOUS | Status: DC

## 2023-06-17 MED ORDER — ACETAMINOPHEN 500 MG PO TABS: 1000.0000 mg | ORAL_TABLET | Freq: Once | ORAL | Status: AC

## 2023-06-17 MED ORDER — PROPOFOL 10 MG/ML IV BOLUS
INTRAVENOUS | Status: AC
  Filled 2023-06-17: qty 20

## 2023-06-17 MED ORDER — CEFAZOLIN SODIUM-DEXTROSE 2-4 GM/100ML-% IV SOLN
INTRAVENOUS | Status: AC
  Filled 2023-06-17: qty 100

## 2023-06-17 MED ORDER — CEFAZOLIN SODIUM-DEXTROSE 2-4 GM/100ML-% IV SOLN: 2.0000 g | INTRAVENOUS | Status: DC

## 2023-06-17 MED ORDER — ROCURONIUM BROMIDE 10 MG/ML (PF) SYRINGE
PREFILLED_SYRINGE | INTRAVENOUS | Status: AC
  Filled 2023-06-17: qty 10

## 2023-06-17 MED ORDER — ACETAMINOPHEN 500 MG PO TABS
1000.0000 mg | ORAL_TABLET | Freq: Once | ORAL | Status: DC
Start: 2023-06-17 — End: 2023-06-17

## 2023-06-17 MED ORDER — FENTANYL CITRATE (PF) 100 MCG/2ML IJ SOLN
INTRAMUSCULAR | Status: AC
  Filled 2023-06-17: qty 2

## 2023-06-17 MED ORDER — ACETAMINOPHEN 500 MG PO TABS
ORAL_TABLET | ORAL | Status: AC
  Filled 2023-06-17: qty 2

## 2023-06-17 MED ORDER — ONDANSETRON HCL 4 MG/2ML IJ SOLN
INTRAMUSCULAR | Status: AC
  Filled 2023-06-17: qty 2

## 2023-06-17 NOTE — Progress Notes (Signed)
Dr Steward Drone in with patient and mother. Patient wishes to cancel.

## 2023-06-20 ENCOUNTER — Ambulatory Visit (HOSPITAL_BASED_OUTPATIENT_CLINIC_OR_DEPARTMENT_OTHER): Payer: Medicaid Other | Admitting: Physical Therapy

## 2023-06-27 ENCOUNTER — Encounter (HOSPITAL_BASED_OUTPATIENT_CLINIC_OR_DEPARTMENT_OTHER): Payer: Medicaid Other | Admitting: Physical Therapy

## 2023-07-02 ENCOUNTER — Encounter (HOSPITAL_BASED_OUTPATIENT_CLINIC_OR_DEPARTMENT_OTHER): Payer: Medicaid Other | Admitting: Orthopaedic Surgery

## 2023-07-02 ENCOUNTER — Encounter (HOSPITAL_BASED_OUTPATIENT_CLINIC_OR_DEPARTMENT_OTHER): Payer: Medicaid Other | Admitting: Physical Therapy

## 2023-07-09 ENCOUNTER — Encounter (HOSPITAL_BASED_OUTPATIENT_CLINIC_OR_DEPARTMENT_OTHER): Payer: Medicaid Other | Admitting: Physical Therapy

## 2023-07-14 ENCOUNTER — Encounter (HOSPITAL_BASED_OUTPATIENT_CLINIC_OR_DEPARTMENT_OTHER): Payer: Medicaid Other | Admitting: Physical Therapy

## 2023-07-16 ENCOUNTER — Encounter (HOSPITAL_BASED_OUTPATIENT_CLINIC_OR_DEPARTMENT_OTHER): Payer: Medicaid Other | Admitting: Physical Therapy

## 2023-07-21 ENCOUNTER — Encounter (HOSPITAL_BASED_OUTPATIENT_CLINIC_OR_DEPARTMENT_OTHER): Payer: Medicaid Other | Admitting: Physical Therapy

## 2023-07-23 ENCOUNTER — Encounter (HOSPITAL_BASED_OUTPATIENT_CLINIC_OR_DEPARTMENT_OTHER): Payer: Medicaid Other | Admitting: Physical Therapy

## 2023-08-14 ENCOUNTER — Encounter (HOSPITAL_BASED_OUTPATIENT_CLINIC_OR_DEPARTMENT_OTHER): Payer: Self-pay | Admitting: Orthopaedic Surgery

## 2023-08-25 NOTE — Telephone Encounter (Signed)
I spoke with grandmother 1/17. Patient has been rescheduled for surgery on 09/16/23.

## 2023-09-09 ENCOUNTER — Encounter (HOSPITAL_COMMUNITY): Payer: Self-pay

## 2023-09-09 NOTE — Progress Notes (Signed)
 Patient is a 23 yr old male.  I asked patient if he wanted his mother to be present during the PAT appointment.  Patient verbalized that he did not want his mother in the PAT office during the PAT appointment.      PCP - none Cardiologist - none  Chest x-ray - 06/07/23 EKG - 06/07/23 Stress Test - n/a ECHO - n/a Cardiac Cath - n/a  ICD Pacemaker/Loop - n/a  Sleep Study -  n/a  Diabetes - n/a  Aspirin  and Blood Thinner Instructions:  n/a  ERAS - Clears til 11:30 AM DOS  Anesthesia review: no  STOP now taking any Aspirin  (unless otherwise instructed by your surgeon), Aleve , Naproxen , Ibuprofen , Motrin , Advil , Goody's, BC's, all herbal medications, fish oil, and all vitamins.   Coronavirus Screening Do you have any of the following symptoms:  Cough yes/no: No Fever (>100.62F)  yes/no: No Runny nose yes/no: No Sore throat yes/no: No Difficulty breathing/shortness of breath  yes/no: No  Have you traveled in the last 14 days and where? yes/no: No  Patient verbalized understanding of instructions that were given to them at the PAT appointment. Patient was also instructed that they will need to review over the PAT instructions again at home before surgery.

## 2023-09-09 NOTE — Progress Notes (Signed)
 Surgical Instructions   Your procedure is scheduled on February 11. Report to [facility] Main Entrance A at 1230 P.M., then check in with the Admitting office. Any questions or running late day of surgery: call [phone]  Questions prior to your surgery date: call [phone], Monday-Friday, 8am-4pm. If you experience any cold or flu symptoms such as cough, fever, chills, shortness of breath, etc. between now and your scheduled surgery, please notify us  at the above number.     Remember:  Do not eat after midnight the night before your surgery   You may drink clear liquids until 11:30am the morning of your surgery.   Clear liquids allowed are: Water, Non-Citrus Juices (without pulp), Carbonated Beverages, Clear Tea (no milk, honey, etc.), Black Coffee Only (NO MILK, CREAM OR POWDERED CREAMER of any kind), and Gatorade.    Take these medicines the morning of surgery with A SIP OF WATER  acetaminophen  (TYLENOL ) if needed   One week prior to surgery, STOP taking any Aspirin  (unless otherwise instructed by your surgeon) Aleve , Naproxen , Ibuprofen , Motrin , Advil , Goody's, BC's, all herbal medications, fish oil, and non-prescription vitamins.                     Do NOT Smoke (Tobacco/Vaping) for 24 hours prior to your procedure.  If you use a CPAP at night, you may bring your mask/headgear for your overnight stay.   You will be asked to remove any contacts, glasses, piercing's, hearing aid's, dentures/partials prior to surgery. Please bring cases for these items if needed.    Patients discharged the day of surgery will not be allowed to drive home, and someone needs to stay with them for 24 hours.  SURGICAL WAITING ROOM VISITATION Patients may have no more than 2 support people in the waiting area - these visitors may rotate.   Pre-op nurse will coordinate an appropriate time for 1 ADULT support person, who may not rotate, to accompany patient in pre-op.  Children under the age  of 68 must have an adult with them who is not the patient and must remain in the main waiting area with an adult.  If the patient needs to stay at the hospital during part of their recovery, the visitor guidelines for inpatient rooms apply.  Please refer to the [facility] website for the visitor guidelines for any additional information.   If you received a COVID test during your pre-op visit  it is requested that you wear a mask when out in public, stay away from anyone that may not be feeling well and notify your surgeon if you develop symptoms. If you have been in contact with anyone that has tested positive in the last 10 days please notify you surgeon.      Pre-operative CHG Bathing Instructions   You can play a key role in reducing the risk of infection after surgery. Your skin needs to be as free of germs as possible. You can reduce the number of germs on your skin by washing with CHG (chlorhexidine  gluconate) soap before surgery. CHG is an antiseptic soap that kills germs and continues to kill germs even after washing.   DO NOT use if you have an allergy to chlorhexidine /CHG or antibacterial soaps. If your skin becomes reddened or irritated, stop using the CHG and notify one of our RNs at [phone].              TAKE A SHOWER THE NIGHT BEFORE SURGERY AND THE DAY  OF SURGERY    Please keep in mind the following:  DO NOT shave, including legs and underarms, 48 hours prior to surgery.   You may shave your face before/day of surgery.  Place clean sheets on your bed the night before surgery Use a clean washcloth (not used since being washed) for each shower. DO NOT sleep with pet's night before surgery.  CHG Shower Instructions:  Wash your face and private area with normal soap. If you choose to wash your hair, wash first with your normal shampoo.  After you use shampoo/soap, rinse your hair and body thoroughly to remove shampoo/soap residue.  Turn the water OFF and apply half the  bottle of CHG soap to a CLEAN washcloth.  Apply CHG soap ONLY FROM YOUR NECK DOWN TO YOUR TOES (washing for 3-5 minutes)  DO NOT use CHG soap on face, private areas, open wounds, or sores.  Pay special attention to the area where your surgery is being performed.  If you are having back surgery, having someone wash your back for you may be helpful. Wait 2 minutes after CHG soap is applied, then you may rinse off the CHG soap.  Pat dry with a clean towel  Put on clean pajamas    Additional instructions for the day of surgery: DO NOT APPLY any lotions, deodorants, cologne, or perfumes.   Do not wear jewelry or makeup Do not wear nail polish, gel polish, artificial nails, or any other type of covering on natural nails (fingers and toes) Do not bring valuables to the hospital. [facility] is not responsible for valuables/personal belongings. Put on clean/comfortable clothes.  Please brush your teeth.  Ask your nurse before applying any prescription medications to the skin.

## 2023-09-10 ENCOUNTER — Encounter (HOSPITAL_COMMUNITY): Payer: Self-pay

## 2023-09-10 ENCOUNTER — Encounter (HOSPITAL_COMMUNITY)
Admission: RE | Admit: 2023-09-10 | Discharge: 2023-09-10 | Disposition: A | Discharge status: Home / Self Care | Payer: Medicaid Other | Source: Ambulatory Visit | Attending: Orthopaedic Surgery | Admitting: Orthopaedic Surgery

## 2023-09-10 ENCOUNTER — Other Ambulatory Visit: Payer: Self-pay

## 2023-09-10 VITALS — BP 114/75 | HR 55 | Temp 98.1°F | Resp 20 | Ht 72.0 in | Wt 144.7 lb

## 2023-09-10 DIAGNOSIS — Z01812 Encounter for preprocedural laboratory examination: Secondary | ICD-10-CM | POA: Insufficient documentation

## 2023-09-10 DIAGNOSIS — Z01818 Encounter for other preprocedural examination: Secondary | ICD-10-CM

## 2023-09-10 LAB — CBC
HCT: 44.1 % (ref 39.0–52.0)
Hemoglobin: 15.4 g/dL (ref 13.0–17.0)
MCH: 31.2 pg (ref 26.0–34.0)
MCHC: 34.9 g/dL (ref 30.0–36.0)
MCV: 89.3 fL (ref 80.0–100.0)
Platelets: 225 10*3/uL (ref 150–400)
RBC: 4.94 MIL/uL (ref 4.22–5.81)
RDW: 12.3 % (ref 11.5–15.5)
WBC: 5.9 10*3/uL (ref 4.0–10.5)
nRBC: 0 % (ref 0.0–0.2)

## 2023-09-15 NOTE — Progress Notes (Signed)
 LVM with surgery time change for 09/16/23 1305-1439, arrival 1105, stop eating food and midnight, stop drinking clear liquids by 1000, and to all other previous instructions given.

## 2023-09-16 ENCOUNTER — Ambulatory Visit (HOSPITAL_COMMUNITY)
Admission: RE | Admit: 2023-09-16 | Discharge: 2023-09-16 | Disposition: A | Discharge status: Home / Self Care | Payer: Medicaid Other | Source: Ambulatory Visit | Attending: Orthopaedic Surgery | Admitting: Orthopaedic Surgery

## 2023-09-16 ENCOUNTER — Encounter (HOSPITAL_COMMUNITY): Payer: Self-pay | Admitting: Anesthesiology

## 2023-09-16 ENCOUNTER — Encounter (HOSPITAL_COMMUNITY)
Admission: RE | Disposition: A | Discharge status: Home / Self Care | Payer: Self-pay | Source: Ambulatory Visit | Attending: Orthopaedic Surgery

## 2023-09-16 DIAGNOSIS — S43402A Unspecified sprain of left shoulder joint, initial encounter: Secondary | ICD-10-CM | POA: Insufficient documentation

## 2023-09-16 DIAGNOSIS — S43005A Unspecified dislocation of left shoulder joint, initial encounter: Secondary | ICD-10-CM | POA: Insufficient documentation

## 2023-09-16 DIAGNOSIS — Z5320 Procedure and treatment not carried out because of patient's decision for unspecified reasons: Secondary | ICD-10-CM | POA: Diagnosis not present

## 2023-09-16 DIAGNOSIS — Z7982 Long term (current) use of aspirin: Secondary | ICD-10-CM | POA: Diagnosis not present

## 2023-09-16 DIAGNOSIS — X58XXXA Exposure to other specified factors, initial encounter: Secondary | ICD-10-CM | POA: Diagnosis not present

## 2023-09-16 SURGERY — SHOULDER ARTHROSCOPY WITH LABRAL REPAIR
Anesthesia: General | Site: Shoulder | Laterality: Left

## 2023-09-16 MED ORDER — CHLORHEXIDINE GLUCONATE 0.12 % MT SOLN
OROMUCOSAL | Status: AC
  Filled 2023-09-16: qty 15

## 2023-09-16 MED ORDER — LACTATED RINGERS IV SOLN: INTRAVENOUS | Status: DC

## 2023-09-16 MED ORDER — ACETAMINOPHEN 500 MG PO TABS
ORAL_TABLET | ORAL | Status: AC
  Filled 2023-09-16: qty 2

## 2023-09-16 MED ORDER — ACETAMINOPHEN 500 MG PO TABS
1000.0000 mg | ORAL_TABLET | Freq: Once | ORAL | Status: AC
  Administered 2023-09-16: 1000 mg via ORAL
  Filled 2023-09-16: qty 2

## 2023-09-16 MED ORDER — CHLORHEXIDINE GLUCONATE 0.12 % MT SOLN
15.0000 mL | Freq: Once | OROMUCOSAL | Status: AC
  Administered 2023-09-16: 15 mL via OROMUCOSAL
  Filled 2023-09-16: qty 15

## 2023-09-16 MED ORDER — ORAL CARE MOUTH RINSE: 15.0000 mL | Freq: Once | OROMUCOSAL | Status: AC

## 2023-09-16 NOTE — H&P (Signed)
Post Operative Evaluation      Procedure/Date of Surgery: Left shoulder arthroscopic labral repair 1/29   Interval History:      Presents today for follow-up of his left shoulder MRI.  He is still having feelings of residual instability particularly in the back.   PMH/PSH/Family History/Social History/Meds/Allergies:         Past Medical History:  Diagnosis Date   Attention deficit hyperactivity disorder (ADHD)     Epistaxis     PONV (postoperative nausea and vomiting)               Past Surgical History:  Procedure Laterality Date   NOSE SURGERY       SHOULDER ARTHROSCOPY WITH LABRAL REPAIR Left 09/02/2022    Procedure: LEFT SHOULDER ARTHROSCOPY WITH LABRAL REPAIR;  Surgeon: Huel Cote, MD;  Location: [facility] OR;  Service: Orthopedics;  Laterality: Left;        Social History         Socioeconomic History   Marital status: Single      Spouse name: Not on file   Number of children: Not on file   Years of education: Not on file   Highest education level: Not on file  Occupational History   Not on file  Tobacco Use   Smoking status: Never      Passive exposure: Yes   Smokeless tobacco: Never  Vaping Use   Vaping status: Not on file  Substance and Sexual Activity   Alcohol use: No   Drug use: No   Sexual activity: Not Currently  Other Topics Concern   Not on file  Social History Narrative   Not on file    Social Determinants of Health    Financial Resource Strain: Not on file  Food Insecurity: Not on file  Transportation Needs: Not on file  Physical Activity: Not on file  Stress: Not on file  Social Connections: Not on file    No family history on file.     Allergies  No Known Allergies         Current Outpatient Medications  Medication Sig Dispense Refill   acetaminophen (TYLENOL) 500 MG tablet Take 1 tablet (500 mg total) by mouth every 8 (eight) hours for 10 days. 30 tablet 0   aspirin EC 325 MG tablet Take 1 tablet (325 mg  total) by mouth daily. 14 tablet 0   ibuprofen (ADVIL) 800 MG tablet Take 1 tablet (800 mg total) by mouth every 8 (eight) hours for 10 days. Please take with food, please alternate with acetaminophen 30 tablet 0   oxyCODONE (ROXICODONE) 5 MG immediate release tablet Take 1 tablet (5 mg total) by mouth every 4 (four) hours as needed for severe pain or breakthrough pain. 15 tablet 0   acetaminophen (TYLENOL) 500 MG tablet Take 1,000 mg by mouth every 6 (six) hours as needed for moderate pain.       aspirin EC 325 MG tablet Take 1 tablet (325 mg total) by mouth daily. 30 tablet 0   ibuprofen (ADVIL) 600 MG tablet Take 1 tablet (600 mg total) by mouth every 6 (six) hours as needed for moderate pain. 30 tablet 0   Melatonin 10 MG TABS Take 10 mg by mouth at bedtime as needed (sleep).       Menthol, Topical Analgesic, (MINERAL ICE EX) Apply 1 Application topically daily as needed (pain).       oxyCODONE (OXY IR/ROXICODONE) 5 MG immediate release tablet  Take 1 tablet (5 mg total) by mouth every 4 (four) hours as needed (severe pain). 10 tablet 0      No current facility-administered medications for this visit.      Imaging Results (Last 48 hours)  No results found.     Review of Systems:   A ROS was performed including pertinent positives and negatives as documented in the HPI.     Musculoskeletal Exam:     Left shoulder incisions are well-appearing.  He has positive apprehension with abduction and external rotation of 90/90.  He has pain about the glenohumeral joint.  2+ anterior shift with 2+ posterior shift.  Positive jerk maneuver.  Remainder of distal neurosensory exam is intact   Left wrist with swelling and tenderness particular about the ulnar styloid.  No tenderness about the scaphoid.  There is limited range of motion about the wrist.  2+ radial pulse   Imaging:     X-rays 3 views left wrist, CT scan left wrist: There is a minimally displaced ulnar styloid fracture as well as a  cortical fracture of the dorsal aspect of the distal radius   MRI left shoulder: There is a posterior inferior labral tear   I personally reviewed and interpreted the radiographs.     Assessment:   Status post left shoulder arthroscopic labral repair fortunately with a recurrence and subsequent shoulder dislocation.  Unfortunately does have a inferior labral tear both posteriorly and likely anteriorly.  Given the fact that he has not had a recurrence of instability we did discuss surgical intervention.  I do believe he would benefit from a left shoulder arthroscopy and revision labral repair.  Specifically I do not believe that he has a significant amount of bone loss to recommend any type of bony procedure.  He does appear to have good labral tissue that could be revised in terms of a repair.  All limitations and restrictions were discussed   Plan :     -Plan for left shoulder arthroscopy with labral repair     After a lengthy discussion of treatment options, including risks, benefits, alternatives, complications of surgical and nonsurgical conservative options, the patient elected surgical repair.    The patient  is aware of the material risks  and complications including, but not limited to injury to adjacent structures, neurovascular injury, infection, numbness, bleeding, implant failure, thermal burns, stiffness, persistent pain, failure to heal, disease transmission from allograft, need for further surgery, dislocation, anesthetic risks, blood clots, risks of death,and others. The probabilities of surgical success and failure discussed with patient given their particular co-morbidities.The time and nature of expected rehabilitation and recovery was discussed.The patient's questions were all answered preoperatively.  No barriers to understanding were noted. I explained the natural history of the disease process and Rx rationale.  I explained to the patient what I considered to be reasonable  expectations given their personal situation.  The final treatment plan was arrived at through a shared patient decision making process model.             I personally saw and evaluated the patient, and participated in the management and treatment plan.   Huel Cote, MD Attending Physician, Orthopedic Surgery   This document was dictated using Dragon voice recognition software. A reasonable attempt at proof reading has been made to minimize errors.

## 2023-09-16 NOTE — Progress Notes (Signed)
Patient has decided against surgery today.  Dr. Steward Drone made aware.  Dr. Steward Drone had already been into the room to speak the patient and his Mother.

## 2023-09-16 NOTE — Anesthesia Preprocedure Evaluation (Signed)
Anesthesia Evaluation    Reviewed: Allergy & Precautions, Patient's Chart, lab work & pertinent test results  History of Anesthesia Complications (+) PONV and history of anesthetic complications  Airway        Dental   Pulmonary neg pulmonary ROS          Cardiovascular negative cardio ROS      Neuro/Psych negative neurological ROS     GI/Hepatic negative GI ROS, Neg liver ROS,,,  Endo/Other  negative endocrine ROS    Renal/GU negative Renal ROS  negative genitourinary   Musculoskeletal LEFT GLENOHUMERAL LABRAL TEAR   Abdominal   Peds  Hematology negative hematology ROS (+)   Anesthesia Other Findings Day of surgery medications reviewed with patient.  Reproductive/Obstetrics negative OB ROS                             Anesthesia Physical Anesthesia Plan Anesthesia Quick Evaluation

## 2023-09-18 NOTE — Therapy (Incomplete)
OUTPATIENT PHYSICAL THERAPY SHOULDER EVALUATION   Patient Name: Eric Tapia MRN: [id number] DOB:10-08-2000, 23 y.o., male Today's Date: 09/18/2023  END OF SESSION:   Past Medical History:  Diagnosis Date   Attention deficit hyperactivity disorder (ADHD)    no meds   Epistaxis    PONV (postoperative nausea and vomiting)    Past Surgical History:  Procedure Laterality Date   NOSE SURGERY     SHOULDER ARTHROSCOPY WITH LABRAL REPAIR Left 09/02/2022   Procedure: LEFT SHOULDER ARTHROSCOPY WITH LABRAL REPAIR;  Surgeon: Huel Cote, MD;  Location: [facility] OR;  Service: Orthopedics;  Laterality: Left;   Patient Active Problem List   Diagnosis Date Noted   Anterior dislocation of left shoulder 09/02/2022    PCP: ***  REFERRING PROVIDER: ***  REFERRING DIAG: ***  THERAPY DIAG:  No diagnosis found.  Rationale for Evaluation and Treatment: {HABREHAB:27488}  ONSET DATE: ***  SUBJECTIVE:                                                                                                                                                                                      SUBJECTIVE STATEMENT: Pt initially had a L anterior shoulder dislocation playing baseball in 2023 and had a subseqent dislocation/relocation several days later.  Pt underwent labral repair on 09/02/2022.  He received PT from Feb 2024 - June 2024.   Hand dominance: {MISC; OT HAND [phone]}  PERTINENT HISTORY: ***  PAIN:  Are you having pain? {OPRCPAIN:27236}  PRECAUTIONS: {Therapy precautions:24002}  RED FLAGS: {PT Red Flags:29287}   WEIGHT BEARING RESTRICTIONS: {Yes ***/No:24003}  FALLS:  Has patient fallen in last 6 months? {fallsyesno:27318}  LIVING ENVIRONMENT: Lives with: {OPRC lives with:25569::"lives with their family"} Lives in: {Lives in:25570} Stairs: {opstairs:27293} Has following equipment at home: {Assistive devices:23999}  OCCUPATION: ***  PLOF: {PLOF:24004}  PATIENT  GOALS:***  NEXT MD VISIT:   OBJECTIVE:  Note: Objective measures were completed at Evaluation unless otherwise noted.  DIAGNOSTIC FINDINGS:  ***  PATIENT SURVEYS:  {rehab surveys:24030:a}  COGNITION: Overall cognitive status: {cognition:24006}     SENSATION: {sensation:27233}  POSTURE: ***  UPPER EXTREMITY ROM:   {AROM/PROM:27142} ROM Right eval Left eval  Shoulder flexion    Shoulder extension    Shoulder abduction    Shoulder adduction    Shoulder internal rotation    Shoulder external rotation    Elbow flexion    Elbow extension    Wrist flexion    Wrist extension    Wrist ulnar deviation    Wrist radial deviation    Wrist pronation    Wrist supination    (Blank rows = not tested)  UPPER EXTREMITY MMT:  MMT  Right eval Left eval  Shoulder flexion    Shoulder extension    Shoulder abduction    Shoulder adduction    Shoulder internal rotation    Shoulder external rotation    Middle trapezius    Lower trapezius    Elbow flexion    Elbow extension    Wrist flexion    Wrist extension    Wrist ulnar deviation    Wrist radial deviation    Wrist pronation    Wrist supination    Grip strength (lbs)    (Blank rows = not tested)  SHOULDER SPECIAL TESTS: Impingement tests: {shoulder impingement test:25231:a} SLAP lesions: {SLAP lesions:25232} Instability tests: {shoulder instability test:25233} Rotator cuff assessment: {rotator cuff assessment:25234} Biceps assessment: {biceps assessment:25235}  JOINT MOBILITY TESTING:  ***  PALPATION:  ***                                                                                                                             TREATMENT DATE: ***   PATIENT EDUCATION: Education details: *** Person educated: {Person educated:25204} Education method: {Education Method:25205} Education comprehension: {Education Comprehension:25206}  HOME EXERCISE PROGRAM: ***  ASSESSMENT:  CLINICAL  IMPRESSION: Patient is a *** y.o. *** who was seen today for physical therapy evaluation and treatment for ***.   OBJECTIVE IMPAIRMENTS: {opptimpairments:25111}.   ACTIVITY LIMITATIONS: {activitylimitations:27494}  PARTICIPATION LIMITATIONS: {participationrestrictions:25113}  PERSONAL FACTORS: {Personal factors:25162} are also affecting patient's functional outcome.   REHAB POTENTIAL: {rehabpotential:25112}  CLINICAL DECISION MAKING: {clinical decision making:25114}  EVALUATION COMPLEXITY: {Evaluation complexity:25115}   GOALS: Goals reviewed with patient? {yes/no:20286}  SHORT TERM GOALS: Target date: ***  *** Baseline: Goal status: INITIAL  2.  *** Baseline:  Goal status: INITIAL  3.  *** Baseline:  Goal status: INITIAL  4.  *** Baseline:  Goal status: INITIAL  5.  *** Baseline:  Goal status: INITIAL  6.  *** Baseline:  Goal status: INITIAL  LONG TERM GOALS: Target date: ***  *** Baseline:  Goal status: INITIAL  2.  *** Baseline:  Goal status: INITIAL  3.  *** Baseline:  Goal status: INITIAL  4.  *** Baseline:  Goal status: INITIAL  5.  *** Baseline:  Goal status: INITIAL  6.  *** Baseline:  Goal status: INITIAL  PLAN:  PT FREQUENCY: {rehab frequency:25116}  PT DURATION: {rehab duration:25117}  PLANNED INTERVENTIONS: {rehab planned interventions:25118::"97110-Therapeutic exercises","97530- Therapeutic [phone]- Neuromuscular re-education","97535- Self NWGN","56213- Manual therapy"}  PLAN FOR NEXT SESSION: Aaron Edelman, PT 09/18/2023, 10:06 PM

## 2023-09-19 ENCOUNTER — Ambulatory Visit (HOSPITAL_BASED_OUTPATIENT_CLINIC_OR_DEPARTMENT_OTHER): Payer: Medicaid Other | Attending: Orthopaedic Surgery | Admitting: Physical Therapy

## 2023-09-25 ENCOUNTER — Ambulatory Visit (HOSPITAL_BASED_OUTPATIENT_CLINIC_OR_DEPARTMENT_OTHER): Payer: Medicaid Other | Admitting: Physical Therapy

## 2023-10-01 ENCOUNTER — Encounter (HOSPITAL_BASED_OUTPATIENT_CLINIC_OR_DEPARTMENT_OTHER): Payer: Medicaid Other | Admitting: Orthopaedic Surgery

## 2023-10-02 ENCOUNTER — Encounter (HOSPITAL_BASED_OUTPATIENT_CLINIC_OR_DEPARTMENT_OTHER): Payer: Medicaid Other | Admitting: Physical Therapy

## 2023-10-09 ENCOUNTER — Encounter (HOSPITAL_BASED_OUTPATIENT_CLINIC_OR_DEPARTMENT_OTHER): Payer: Medicaid Other | Admitting: Physical Therapy

## 2023-10-13 ENCOUNTER — Encounter (HOSPITAL_BASED_OUTPATIENT_CLINIC_OR_DEPARTMENT_OTHER): Payer: Medicaid Other | Admitting: Physical Therapy

## 2023-10-16 ENCOUNTER — Encounter (HOSPITAL_BASED_OUTPATIENT_CLINIC_OR_DEPARTMENT_OTHER): Payer: Medicaid Other | Admitting: Physical Therapy

## 2023-10-20 ENCOUNTER — Encounter (HOSPITAL_BASED_OUTPATIENT_CLINIC_OR_DEPARTMENT_OTHER): Payer: Medicaid Other | Admitting: Physical Therapy

## 2023-10-23 ENCOUNTER — Encounter (HOSPITAL_BASED_OUTPATIENT_CLINIC_OR_DEPARTMENT_OTHER): Payer: Medicaid Other | Admitting: Physical Therapy

## 2024-01-15 ENCOUNTER — Encounter (HOSPITAL_BASED_OUTPATIENT_CLINIC_OR_DEPARTMENT_OTHER): Payer: Self-pay | Admitting: Emergency Medicine

## 2024-01-15 ENCOUNTER — Emergency Department (HOSPITAL_BASED_OUTPATIENT_CLINIC_OR_DEPARTMENT_OTHER)
Admission: EM | Admit: 2024-01-15 | Discharge: 2024-01-15 | Disposition: A | Discharge status: Home / Self Care | Attending: Emergency Medicine | Admitting: Emergency Medicine

## 2024-01-15 ENCOUNTER — Other Ambulatory Visit (HOSPITAL_BASED_OUTPATIENT_CLINIC_OR_DEPARTMENT_OTHER): Payer: Self-pay

## 2024-01-15 ENCOUNTER — Other Ambulatory Visit: Payer: Self-pay

## 2024-01-15 DIAGNOSIS — S6991XA Unspecified injury of right wrist, hand and finger(s), initial encounter: Secondary | ICD-10-CM | POA: Diagnosis present

## 2024-01-15 DIAGNOSIS — S61212A Laceration without foreign body of right middle finger without damage to nail, initial encounter: Secondary | ICD-10-CM | POA: Insufficient documentation

## 2024-01-15 DIAGNOSIS — Z23 Encounter for immunization: Secondary | ICD-10-CM | POA: Insufficient documentation

## 2024-01-15 DIAGNOSIS — S61411A Laceration without foreign body of right hand, initial encounter: Secondary | ICD-10-CM | POA: Diagnosis not present

## 2024-01-15 DIAGNOSIS — W260XXA Contact with knife, initial encounter: Secondary | ICD-10-CM | POA: Diagnosis not present

## 2024-01-15 MED ORDER — LIDOCAINE-EPINEPHRINE-TETRACAINE (LET) TOPICAL GEL
3.0000 mL | Freq: Once | TOPICAL | Status: AC
  Administered 2024-01-15: 3 mL via TOPICAL
  Filled 2024-01-15: qty 3

## 2024-01-15 MED ORDER — CEPHALEXIN 250 MG PO CAPS
500.0000 mg | ORAL_CAPSULE | Freq: Once | ORAL | Status: AC
  Administered 2024-01-15: 500 mg via ORAL
  Filled 2024-01-15: qty 2

## 2024-01-15 MED ORDER — TETANUS-DIPHTH-ACELL PERTUSSIS 5-2.5-18.5 LF-MCG/0.5 IM SUSY
0.5000 mL | PREFILLED_SYRINGE | Freq: Once | INTRAMUSCULAR | Status: AC
  Administered 2024-01-15: 0.5 mL via INTRAMUSCULAR
  Filled 2024-01-15: qty 0.5

## 2024-01-15 MED ORDER — CEPHALEXIN 500 MG PO CAPS
500.0000 mg | ORAL_CAPSULE | Freq: Four times a day (QID) | ORAL | 0 refills | Status: AC
  Filled 2024-01-15: qty 20, 5d supply, fill #0

## 2024-01-15 NOTE — ED Provider Notes (Signed)
 [place] EMERGENCY DEPARTMENT AT [facility] Provider Note   CSN: [id number] Arrival date & time: 01/15/24  1209     Patient presents with: Laceration  HPI Eric Tapia is a 23 y.o. male presenting for laceration.  Occurred yesterday evening.  Located about the right middle knuckle.  He states he was playing with a butterfly knife.  He states the knife was clean.  He does report that his tetanus shot is up-to-date.  Said there was some bleeding but not controlled.  Reporting some redness and swelling around the area.  Also reports that range of motion of the finger and hand is still intact.  Sensation also intact per patient.    Laceration      Prior to Admission medications   Medication Sig Start Date End Date Taking? Authorizing Provider  cephALEXin (KEFLEX) 500 MG capsule Take 1 capsule (500 mg total) by mouth 4 (four) times daily. 01/15/24  Yes Loanne Emery K, PA-C  acetaminophen  (TYLENOL ) 500 MG tablet Take 1,000 mg by mouth every 6 (six) hours as needed for moderate pain.    [provider]  ibuprofen  (ADVIL ) 200 MG tablet Take 800 mg by mouth every 8 (eight) hours as needed (pain.).    [provider]    Allergies: Patient has no known allergies.   Review of Systems See above  Updated Vital Signs BP 134/85 (BP Location: Left Arm)   Pulse 69   Temp 98 F (36.7 C)   Resp 16   SpO2 100%   Physical Exam Constitutional:      Appearance: Normal appearance.  HENT:     Head: Normocephalic.     Nose: Nose normal.   Eyes:     Conjunctiva/sclera: Conjunctivae normal.    Cardiovascular:     Pulses:          Radial pulses are 2+ on the right side and 2+ on the left side.  Pulmonary:     Effort: Pulmonary effort is normal.   Musculoskeletal:       Hands:   Neurological:     Mental Status: He is alert.   Psychiatric:        Mood and Affect: Mood normal.     (all labs ordered are listed, but only abnormal results are  displayed) Labs Reviewed - No data to display  EKG: None  Radiology: No results found.   .Laceration Repair  Date/Time: 01/15/2024 1:17 PM  Performed by: Janalee Mcmurray, PA-C Authorized by: Janalee Mcmurray, PA-C   Consent:    Consent obtained:  Verbal   Consent given by:  Patient   Risks discussed:  Infection, retained foreign body and need for additional repair Universal protocol:    Procedure explained and questions answered to patient or proxy's satisfaction: yes     Relevant documents present and verified: yes     Patient identity confirmed:  Verbally with patient and arm band Anesthesia:    Anesthesia method:  Topical application   Topical anesthetic:  LET Laceration details:    Location:  Finger   Finger location:  R long finger   Length (cm):  1   Depth (mm):  2 Exploration:    Hemostasis achieved with:  Direct pressure   Imaging outcome: foreign body not noted     Wound exploration: wound explored through full range of motion     Wound extent: areolar tissue violated     Contaminated: no   Treatment:    Area cleansed  with:  Saline   Amount of cleaning:  Extensive   Irrigation solution:  Sterile saline   Irrigation method:  Pressure wash   Visualized foreign bodies/material removed: no     Debridement:  Minimal   Undermining:  None   Scar revision: no   Skin repair:    Repair method:  Tissue adhesive Approximation:    Approximation:  Close Repair type:    Repair type:  Intermediate Post-procedure details:    Dressing:  Open (no dressing)   Procedure completion:  Tolerated well, no immediate complications    Medications Ordered in the ED  Tdap (BOOSTRIX ) injection 0.5 mL (has no administration in time range)  cephALEXin (KEFLEX) capsule 500 mg (has no administration in time range)  lidocaine -EPINEPHrine -tetracaine (LET) topical gel (3 mLs Topical Given 01/15/24 1246)                                    Medical Decision Making 23 year old  well-appearing male presenting for laceration.  Exam notable for 1 cm laceration to the dorsal aspect just proximal to the right middle MCP.  There is a small area of surrounding erythema and edema but overall the wound did appear to be clean and have a suspicion for infection at this time.  Laceration repair went well and was well-tolerated.  Reviewed chart and has not received Tdap since 2014.  Administered Tdap here.  Started him on Keflex given the surrounding erythema and edema.  Advised appropriate treatment at home.  Advised to follow-up with his PCP.  Discussed return precautions.  Discharged good condition.     Final diagnoses:  Laceration of right hand without foreign body, initial encounter    ED Discharge Orders          Ordered    cephALEXin (KEFLEX) 500 MG capsule  4 times daily        01/15/24 1322               Angles Trevizo K, PA-C 01/15/24 1324    Sueellen Emery, MD 01/15/24 1330

## 2024-01-15 NOTE — Discharge Instructions (Addendum)
 Laceration repair went well today.  Please keep the laceration site clean daily with soap and water.  Do not scrub the site but gently dab with a clean washcloth.  The glue will eventually fall off on its own.  If there is increasing redness, swelling, oozing, develop a fever or any other concerning symptom please return to the ED for further evaluation.  Otherwise you can follow-up with your PCP.

## 2024-01-15 NOTE — ED Notes (Signed)
 Reviewed AVS/discharge instruction with patient. Time allotted for and all questions answered. Patient is agreeable for d/c and escorted to ed exit by staff.

## 2024-01-15 NOTE — ED Triage Notes (Signed)
 Lac to R middle knuckle. Bleeding controlled. Tetanus shot up to date. Eric Tapia

## 2024-05-10 ENCOUNTER — Emergency Department (HOSPITAL_COMMUNITY)

## 2024-05-10 ENCOUNTER — Other Ambulatory Visit: Payer: Self-pay

## 2024-05-10 ENCOUNTER — Encounter (HOSPITAL_COMMUNITY): Payer: Self-pay | Admitting: *Deleted

## 2024-05-10 ENCOUNTER — Emergency Department (HOSPITAL_COMMUNITY)
Admission: EM | Admit: 2024-05-10 | Discharge: 2024-05-10 | Disposition: A | Discharge status: Home / Self Care | Attending: Emergency Medicine | Admitting: Emergency Medicine

## 2024-05-10 DIAGNOSIS — R079 Chest pain, unspecified: Secondary | ICD-10-CM | POA: Diagnosis not present

## 2024-05-10 DIAGNOSIS — R0789 Other chest pain: Secondary | ICD-10-CM | POA: Diagnosis not present

## 2024-05-10 LAB — BASIC METABOLIC PANEL WITH GFR
Anion gap: 10 (ref 5–15)
BUN: 10 mg/dL (ref 6–20)
CO2: 26 mmol/L (ref 22–32)
Calcium: 9 mg/dL (ref 8.9–10.3)
Chloride: 103 mmol/L (ref 98–111)
Creatinine, Ser: 1.11 mg/dL (ref 0.61–1.24)
GFR, Estimated: >60 mL/min (ref >60)
Glucose, Bld: 90 mg/dL (ref 70–99)
Potassium: 3.9 mmol/L (ref 3.5–5.1)
Sodium: 139 mmol/L (ref 135–145)

## 2024-05-10 LAB — CBC
HCT: 47 % (ref 39.0–52.0)
Hemoglobin: 16.3 g/dL (ref 13.0–17.0)
MCH: 31.2 pg (ref 26.0–34.0)
MCHC: 34.7 g/dL (ref 30.0–36.0)
MCV: 89.9 fL (ref 80.0–100.0)
Platelets: 206 K/uL (ref 150–400)
RBC: 5.23 MIL/uL (ref 4.22–5.81)
RDW: 12 % (ref 11.5–15.5)
WBC: 6.3 K/uL (ref 4.0–10.5)
nRBC: 0 % (ref 0.0–0.2)

## 2024-05-10 LAB — TROPONIN I (HIGH SENSITIVITY): Troponin I (High Sensitivity): <2 ng/L (ref <18)

## 2024-05-10 NOTE — Discharge Instructions (Addendum)
Return if any problems.  Ibuprofen for discomfort 

## 2024-05-10 NOTE — ED Triage Notes (Signed)
 C/o chest pain onset 2 weeks ago , states no change today just hasn't gone away. Worse at times with movement

## 2024-05-11 NOTE — ED Provider Notes (Signed)
 [place] EMERGENCY DEPARTMENT AT [facility] Provider Note   CSN: [id number] Arrival date & time: 05/10/24  1016     Patient presents with: No chief complaint on file.   Infant Zink is a 23 y.o. male.   Pt complains of chest pain.  Pt reports he began having pain 2 weeks ago.  Pt reports pain is worse with exertion.  Pt reports he has not had a fever or chills.  No shortness of breath.  No fever.  Pt denies cough or congestion.  No abdominal pain   The history is provided by the patient. No language interpreter was used.       Prior to Admission medications   Medication Sig Start Date End Date Taking? Authorizing Provider  acetaminophen  (TYLENOL ) 500 MG tablet Take 1,000 mg by mouth every 6 (six) hours as needed for moderate pain.    [provider]  cephALEXin  (KEFLEX ) 500 MG capsule Take 1 capsule (500 mg total) by mouth 4 (four) times daily. 01/15/24   Robinson, John K, PA-C  ibuprofen  (ADVIL ) 200 MG tablet Take 800 mg by mouth every 8 (eight) hours as needed (pain.).    [provider]    Allergies: Patient has no known allergies.    Review of Systems  Respiratory:  Negative for cough.   All other systems reviewed and are negative.   Updated Vital Signs BP 121/66 (BP Location: Left Arm)   Pulse 62   Temp 98.4 F (36.9 C)   Resp 16   Ht 6' (1.829 m)   Wt 68 kg   SpO2 99%   BMI 20.34 kg/m   Physical Exam Vitals and nursing note reviewed.  Constitutional:      Appearance: He is well-developed.  HENT:     Head: Normocephalic.     Mouth/Throat:     Mouth: Mucous membranes are moist.  Cardiovascular:     Rate and Rhythm: Normal rate.  Pulmonary:     Effort: Pulmonary effort is normal.  Abdominal:     General: Abdomen is flat. There is no distension.  Musculoskeletal:        General: Normal range of motion.     Cervical back: Normal range of motion.  Skin:    General: Skin is warm.  Neurological:     General: No focal  deficit present.     Mental Status: He is alert and oriented to person, place, and time.  Psychiatric:        Mood and Affect: Mood normal.     (all labs ordered are listed, but only abnormal results are displayed) Labs Reviewed  BASIC METABOLIC PANEL WITH GFR  CBC  TROPONIN I (HIGH SENSITIVITY)  TROPONIN I (HIGH SENSITIVITY)    EKG: EKG Interpretation Date/Time:  Monday May 10 2024 10:36:43 EDT Ventricular Rate:  66 PR Interval:  180 QRS Duration:  106 QT Interval:  378 QTC Calculation: 396 R Axis:   147  Text Interpretation: Possible Right ventricular hypertrophy Abnormal ECG When compared with ECG of 07-Jun-2023 00:51, PREVIOUS ECG IS PRESENT Confirmed by Mannie Pac [phone]) on 05/10/2024 10:44:37 AM  Radiology: [facility] Chest 2 View Result Date: 05/10/2024 CLINICAL DATA:  chest pain EXAM: CHEST - 2 VIEW COMPARISON:  None available. FINDINGS: No focal airspace consolidation, pleural effusion, or pneumothorax. No cardiomegaly.No acute fracture or destructive lesion. IMPRESSION: No acute cardiopulmonary abnormality. Electronically Signed   By: Rogelia Myers M.D.   On: 05/10/2024 11:19     Procedures  Medications Ordered in the ED - No data to display                                  Medical Decision Making Pt complains of pain in his chest for the past 2 weeks   Amount and/or Complexity of Data Reviewed Labs: ordered.    Details: Labs ordered reviewed and interpreted,  troponin is negative Radiology: ordered.    Details: Chest xray  no fracture  ECG/medicine tests: ordered and independent interpretation performed. Decision-making details documented in ED Course.    Details: EKG  no acute findings, normal sinus   Risk OTC drugs. Risk Details: Pt advised to continue ibuprofen .  Return if any problems.         Final diagnoses:  Chest pain, muscular    ED Discharge Orders     None       An After Visit Summary was printed and given to the  patient.    Flint Sonny POUR, PA-C 05/11/24 1030    Emil Share, DO 05/11/24 1038

## 2024-05-27 ENCOUNTER — Emergency Department (HOSPITAL_BASED_OUTPATIENT_CLINIC_OR_DEPARTMENT_OTHER)
Admission: EM | Admit: 2024-05-27 | Discharge: 2024-05-27 | Disposition: A | Discharge status: Home / Self Care | Attending: Emergency Medicine | Admitting: Emergency Medicine

## 2024-05-27 ENCOUNTER — Emergency Department (HOSPITAL_BASED_OUTPATIENT_CLINIC_OR_DEPARTMENT_OTHER)

## 2024-05-27 ENCOUNTER — Other Ambulatory Visit: Payer: Self-pay

## 2024-05-27 DIAGNOSIS — S60041A Contusion of right ring finger without damage to nail, initial encounter: Secondary | ICD-10-CM | POA: Diagnosis not present

## 2024-05-27 DIAGNOSIS — W230XXA Caught, crushed, jammed, or pinched between moving objects, initial encounter: Secondary | ICD-10-CM | POA: Insufficient documentation

## 2024-05-27 DIAGNOSIS — S6010XA Contusion of unspecified finger with damage to nail, initial encounter: Secondary | ICD-10-CM

## 2024-05-27 DIAGNOSIS — S6991XA Unspecified injury of right wrist, hand and finger(s), initial encounter: Secondary | ICD-10-CM

## 2024-05-27 DIAGNOSIS — S60221A Contusion of right hand, initial encounter: Secondary | ICD-10-CM | POA: Diagnosis not present

## 2024-05-27 MED ORDER — ACETAMINOPHEN 500 MG PO TABS
1000.0000 mg | ORAL_TABLET | Freq: Once | ORAL | Status: AC
  Administered 2024-05-27: 1000 mg via ORAL
  Filled 2024-05-27: qty 2

## 2024-05-27 MED ORDER — METHOCARBAMOL 500 MG PO TABS
500.0000 mg | ORAL_TABLET | Freq: Once | ORAL | Status: AC
  Administered 2024-05-27: 500 mg via ORAL
  Filled 2024-05-27: qty 1

## 2024-05-27 MED ORDER — METHOCARBAMOL 500 MG PO TABS: 500.0000 mg | ORAL_TABLET | Freq: Two times a day (BID) | ORAL | 0 refills | Status: AC

## 2024-05-27 NOTE — ED Provider Notes (Signed)
 [place] EMERGENCY DEPARTMENT AT [facility] Provider Note   CSN: [id number] Arrival date & time: 05/27/24  1947     Patient presents with: Hand Injury   Eric Tapia is a 23 y.o. male here for evaluation of injury to right hand 2nd, 3rd and 4th digit.  States he was using a friend's bending pipe machine when his hand got sandwiched in between.  He has pain to the distal aspect of his fingers.  He noticed some blood underneath the nailbed of his fourth digit.  No numbness or weakness.  Hurts to flex and extend at the hand.  When he moves his fingers it radiates into his wrist.  He denies any forearm pain.  No lacerations.  No swelling, redness.  Left hand dominant   HPI     Prior to Admission medications   Medication Sig Start Date End Date Taking? Authorizing Provider  methocarbamol (ROBAXIN) 500 MG tablet Take 1 tablet (500 mg total) by mouth 2 (two) times daily. 05/27/24  Yes Fadumo Heng A, PA-C  acetaminophen  (TYLENOL ) 500 MG tablet Take 1,000 mg by mouth every 6 (six) hours as needed for moderate pain.    [provider]  cephALEXin  (KEFLEX ) 500 MG capsule Take 1 capsule (500 mg total) by mouth 4 (four) times daily. 01/15/24   Robinson, John K, PA-C  ibuprofen  (ADVIL ) 200 MG tablet Take 800 mg by mouth every 8 (eight) hours as needed (pain.).    [provider]    Allergies: Patient has no known allergies.    Review of Systems  Constitutional: Negative.   HENT: Negative.    Respiratory: Negative.    Cardiovascular: Negative.   Gastrointestinal: Negative.   Genitourinary: Negative.   Musculoskeletal:        Right hand pain  Skin: Negative.   Neurological: Negative.   All other systems reviewed and are negative.   Updated Vital Signs BP (!) 135/94 (BP Location: Left Arm)   Pulse 86   Temp 97.8 F (36.6 C)   Resp 17   SpO2 100%   Physical Exam Vitals and nursing note reviewed.  Constitutional:      General: He is not in acute  distress.    Appearance: He is well-developed. He is not ill-appearing or diaphoretic.  HENT:     Head: Atraumatic.  Eyes:     Pupils: Pupils are equal, round, and reactive to light.  Cardiovascular:     Rate and Rhythm: Normal rate and regular rhythm.     Pulses: Normal pulses.          Radial pulses are 2+ on the right side and 2+ on the left side.  Pulmonary:     Effort: Pulmonary effort is normal. No respiratory distress.  Abdominal:     General: There is no distension.     Palpations: Abdomen is soft.  Musculoskeletal:        General: Normal range of motion.     Cervical back: Normal range of motion and neck supple.     Comments: Tenderness right hand diffusely, more so distal 2nd through 4th phalanges.  He has a small, 3 mm rounded subungual hematoma to his fourth digit.  No numbness or weakness.  Full range of motion.  Nontender scaphoid, forearm.  No fusiform swelling.  Skin:    General: Skin is warm and dry.     Capillary Refill: Capillary refill takes less than 2 seconds.     Comments: No edema, erythema, warmth,  rashes or lesions  Neurological:     General: No focal deficit present.     Mental Status: He is alert and oriented to person, place, and time.     Sensory: Sensation is intact.     Motor: Motor function is intact.     (all labs ordered are listed, but only abnormal results are displayed) Labs Reviewed - No data to display  EKG: None  Radiology: DG Wrist Complete Right Result Date: 05/27/2024 CLINICAL DATA:  Crush injury EXAM: RIGHT WRIST - COMPLETE 3+ VIEW COMPARISON:  None Available. FINDINGS: There is no evidence of fracture or dislocation. There is no evidence of arthropathy or other focal bone abnormality. Soft tissues are unremarkable. IMPRESSION: Negative. Electronically Signed   By: Luke Bun M.D.   On: 05/27/2024 20:11   DG Hand Complete Right Result Date: 05/27/2024 CLINICAL DATA:  Crush injury second and third digits EXAM: RIGHT HAND -  COMPLETE 3+ VIEW COMPARISON:  04/11/2023 FINDINGS: There is no evidence of fracture or dislocation. There is no evidence of arthropathy or other focal bone abnormality. Soft tissues are unremarkable. IMPRESSION: Negative. Electronically Signed   By: Luke Bun M.D.   On: 05/27/2024 20:11     Procedures   Medications Ordered in the ED  acetaminophen  (TYLENOL ) tablet 1,000 mg (1,000 mg Oral Given 05/27/24 2052)  methocarbamol (ROBAXIN) tablet 500 mg (500 mg Oral Given 05/27/24 1663)    23 year old here for evaluation of right hand injury occurred just PTA.  Took some ibuprofen .  Crushed his 2nd, 3rd and 4th digits on right hand in a bending point machine just PTA.  Here patient is neurovascularly intact.  Has a small subungual hematoma to his fourth nailbed.  No lacerations, contusions, abrasions.  His compartments are soft.  Full range of motion.  Neurovascularly intact.  Does admit to some wrist pain when he ranges his digits however no scaphoid tenderness.  Nontender forearm.  Will plan on imaging  Imaging personally viewed interpreted X-ray right wrist, right hand without acute fracture, dislocation  Discussed results with patient.  Offered trephination for subungual hematoma however patient declines.  We discussed risk versus benefit.  Voiced understanding.  He appears overall well.  Low suspicion for shearing injury, compartment syndrome, infectious process, ischemic injury, open wound, fracture, dislocation however based off history, exam and imaging low suspicion for above.  Discussed Intermatic management.  Will have him follow-up outpatient, return for new or worsening symptoms  The patient has been appropriately medically screened and/or stabilized in the ED. I have low suspicion for any other emergent medical condition which would require further screening, evaluation or treatment in the ED or require inpatient management.  Patient is hemodynamically stable and in no acute distress.   Patient able to ambulate in department prior to ED.  Evaluation does not show acute pathology that would require ongoing or additional emergent interventions while in the emergency department or further inpatient treatment.  I have discussed the diagnosis with the patient and answered all questions.  Pain is been managed while in the emergency department and patient has no further complaints prior to discharge.  Patient is comfortable with plan discussed in room and is stable for discharge at this time.  I have discussed strict return precautions for returning to the emergency department.  Patient was encouraged to follow-up with PCP/specialist refer to at discharge.  Medical Decision Making Amount and/or Complexity of Data Reviewed Independent Historian: friend External Data Reviewed: labs, radiology and notes. Radiology: ordered and independent interpretation performed. Decision-making details documented in ED Course.  Risk OTC drugs. Prescription drug management. Decision regarding hospitalization. Diagnosis or treatment significantly limited by social determinants of health.        Final diagnoses:  Injury of right hand, initial encounter  Subungual hematoma of finger, initial encounter    ED Discharge Orders          Ordered    methocarbamol (ROBAXIN) 500 MG tablet  2 times daily        05/27/24 2107               Zo Loudon A, PA-C 05/27/24 2111    Ruthe Cornet, DO 05/27/24 2133

## 2024-05-27 NOTE — ED Notes (Addendum)
 Took 600 mg ibuprofen  just PTA. Ice given.

## 2024-05-27 NOTE — Discharge Instructions (Signed)
 It was a pleasure taking care of you here today.  Make sure to continue taking Tylenol  and ibuprofen .  Advised on a muscle relaxer.  Ice and elevate the hand.  We did discuss you have a subungual hematoma which is a collection of blood under the nailbed.  You do not want drainage.  Make sure to follow-up outpatient, return for any worsening symptoms

## 2024-05-27 NOTE — ED Triage Notes (Signed)
 Crush injury bending pipe to right 2nd 3rd digit. Entire hand and wrist painful. Skin intact.

## 2024-06-07 ENCOUNTER — Encounter: Payer: Self-pay | Admitting: Radiology
